# Patient Record
Sex: Male | Born: 1960 | Race: White | Hispanic: No | Marital: Married | Smoking: Current every day smoker
Health system: Southern US, Community
[De-identification: ages and names within clinical notes are randomized; demographics above are authoritative.]

## PROBLEM LIST (undated history)

## (undated) DIAGNOSIS — I6523 Occlusion and stenosis of bilateral carotid arteries: Secondary | ICD-10-CM

## (undated) DIAGNOSIS — R5382 Chronic fatigue, unspecified: Secondary | ICD-10-CM

## (undated) DIAGNOSIS — M179 Osteoarthritis of knee, unspecified: Secondary | ICD-10-CM

## (undated) DIAGNOSIS — E079 Disorder of thyroid, unspecified: Secondary | ICD-10-CM

## (undated) DIAGNOSIS — E785 Hyperlipidemia, unspecified: Secondary | ICD-10-CM

## (undated) DIAGNOSIS — J329 Chronic sinusitis, unspecified: Secondary | ICD-10-CM

## (undated) DIAGNOSIS — M1990 Unspecified osteoarthritis, unspecified site: Secondary | ICD-10-CM

## (undated) HISTORY — DX: Disorder of thyroid, unspecified: E07.9

## (undated) HISTORY — DX: Occlusion and stenosis of bilateral carotid arteries: I65.23

## (undated) HISTORY — DX: Chronic fatigue, unspecified: R53.82

## (undated) HISTORY — DX: Osteoarthritis of knee, unspecified: M17.9

## (undated) HISTORY — DX: Chronic sinusitis, unspecified: J32.9

## (undated) HISTORY — PX: KNEE CARTILAGE SURGERY: SHX688

## (undated) HISTORY — DX: Hyperlipidemia, unspecified: E78.5

## (undated) HISTORY — PX: SKIN GRAFT: SHX250

---

## 2004-05-10 ENCOUNTER — Other Ambulatory Visit (HOSPITAL_COMMUNITY): Admission: RE | Admit: 2004-05-10 | Discharge: 2004-06-04 | Payer: Self-pay | Admitting: Psychiatry

## 2004-05-10 ENCOUNTER — Ambulatory Visit: Payer: Self-pay | Admitting: Psychiatry

## 2005-06-15 ENCOUNTER — Emergency Department (HOSPITAL_COMMUNITY): Admission: EM | Admit: 2005-06-15 | Discharge: 2005-06-15 | Payer: Self-pay | Admitting: Emergency Medicine

## 2009-06-04 ENCOUNTER — Encounter: Admission: RE | Admit: 2009-06-04 | Discharge: 2009-06-04 | Payer: Self-pay | Admitting: Specialist

## 2012-04-25 ENCOUNTER — Emergency Department (HOSPITAL_COMMUNITY)
Admission: EM | Admit: 2012-04-25 | Discharge: 2012-04-25 | Disposition: A | Discharge status: Home / Self Care | Payer: Managed Care, Other (non HMO) | Attending: Emergency Medicine | Admitting: Emergency Medicine

## 2012-04-25 ENCOUNTER — Encounter (HOSPITAL_COMMUNITY): Payer: Self-pay

## 2012-04-25 DIAGNOSIS — F17200 Nicotine dependence, unspecified, uncomplicated: Secondary | ICD-10-CM | POA: Insufficient documentation

## 2012-04-25 DIAGNOSIS — Y9389 Activity, other specified: Secondary | ICD-10-CM | POA: Insufficient documentation

## 2012-04-25 DIAGNOSIS — M129 Arthropathy, unspecified: Secondary | ICD-10-CM | POA: Insufficient documentation

## 2012-04-25 DIAGNOSIS — T1501XA Foreign body in cornea, right eye, initial encounter: Secondary | ICD-10-CM

## 2012-04-25 DIAGNOSIS — T1500XA Foreign body in cornea, unspecified eye, initial encounter: Secondary | ICD-10-CM | POA: Insufficient documentation

## 2012-04-25 DIAGNOSIS — IMO0002 Reserved for concepts with insufficient information to code with codable children: Secondary | ICD-10-CM | POA: Insufficient documentation

## 2012-04-25 DIAGNOSIS — Z79899 Other long term (current) drug therapy: Secondary | ICD-10-CM | POA: Insufficient documentation

## 2012-04-25 DIAGNOSIS — Y9289 Other specified places as the place of occurrence of the external cause: Secondary | ICD-10-CM | POA: Insufficient documentation

## 2012-04-25 HISTORY — DX: Unspecified osteoarthritis, unspecified site: M19.90

## 2012-04-25 MED ORDER — TETRACAINE HCL 0.5 % OP SOLN
2.0000 [drp] | Freq: Once | OPHTHALMIC | Status: AC
  Administered 2012-04-25: 2 [drp] via OPHTHALMIC
  Filled 2012-04-25: qty 2

## 2012-04-25 MED ORDER — HYDROCODONE-ACETAMINOPHEN 5-325 MG PO TABS
1.0000 | ORAL_TABLET | Freq: Once | ORAL | Status: AC
  Administered 2012-04-25: 1 via ORAL
  Filled 2012-04-25: qty 1

## 2012-04-25 MED ORDER — SULFACETAMIDE SODIUM 10 % OP SOLN
2.0000 [drp] | OPHTHALMIC | Status: DC
  Filled 2012-04-25: qty 15

## 2012-04-25 MED ORDER — FLUORESCEIN SODIUM 1 MG OP STRP
1.0000 | ORAL_STRIP | Freq: Once | OPHTHALMIC | Status: AC
  Administered 2012-04-25: 1 via OPHTHALMIC
  Filled 2012-04-25: qty 1

## 2012-04-25 NOTE — ED Provider Notes (Signed)
History     CSN: [id number]  Arrival date & time 04/25/12  0746   First MD Initiated Contact with Patient 04/25/12 0801      Chief Complaint  Patient presents with  . Eye Pain    (Consider location/radiation/quality/duration/timing/severity/associated sxs/prior treatment) HPI Vincent Gray is a 52 y/o male with new onset right eye pain since yesterday morning. He reports feeling a foreign body in his eye that shifts from the upper lid, lateral cornea, and lower lid. The pain got progressively worse throughout the day yesterday. He was unable to sleep much last night. He states bright lights and the sun agitate the pain. He denies any purulent drainage, fever, cough, congestion, and runny nose.  Patient was working with some people cutting trees.  The patient states he was not actually using the chainsaw just around people using them.  Past Medical History  Diagnosis Date  . Arthritis     Past Surgical History  Procedure Laterality Date  . Skin graft    . Knee cartilage surgery      History reviewed. No pertinent family history.  History  Substance Use Topics  . Smoking status: Current Every Day Smoker -- 1.00 packs/day    Types: Cigarettes  . Smokeless tobacco: Not on file  . Alcohol Use: No      Review of Systems All other systems negative except as documented in the HPI. All pertinent positives and negatives as reviewed in the HPI. Allergies  Review of patient's allergies indicates no known allergies.  Home Medications   Current Outpatient Rx  Name  Route  Sig  Dispense  Refill  . gabapentin (NEURONTIN) 300 MG capsule   Oral   Take 600 mg by mouth 2 (two) times daily.         Marland Kitchen HYDROcodone-acetaminophen (LORCET) 10-650 MG per tablet   Oral   Take 1 tablet by mouth every 6 (six) hours as needed for pain.         Marland Kitchen levothyroxine (SYNTHROID, LEVOTHROID) 125 MCG tablet   Oral   Take 250 mcg by mouth daily.         Marland Kitchen loratadine (CLARITIN) 10 MG tablet  Oral   Take 10 mg by mouth daily.           BP 121/83  Pulse 101  Temp(Src) 98.3 F (36.8 C) (Oral)  Resp 20  SpO2 100%  Physical Exam  Constitutional: He appears well-developed and well-nourished.  HENT:  Head: Normocephalic and atraumatic.  Eyes: Pupils are equal, round, and reactive to light. No foreign bodies found. Right conjunctiva is injected.  Slit lamp exam:      The right eye shows foreign body.      ED Course  Procedures (including critical care time)  I was numbed with tetracaine and attempted to remove foreign body, but did not get aggressive with this.  I spoke with Dr. Randon Goldsmith of ophthalmology, who agrees to see the patient as soon as he leaves the ER in his office.  Patient agrees to plan.  Told to return here as needed  MDM          Carlyle Dolly, PA-C 04/25/12 [phone]

## 2012-04-25 NOTE — ED Notes (Signed)
Pt escorted to discharge window. Verbalized understanding discharge instructions. In no acute distress. Pt given directions to Opthalmologist office.

## 2012-04-25 NOTE — ED Notes (Addendum)
Pt c/o pain and "something in" R eye x 3 days.  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  Sts he has tried to clean it out at an eye wash station and tried to flush it with eye drops, but no relief.  Pain score 8/10.  Sts blurred vision.  Redness and drainage noted.

## 2012-04-25 NOTE — ED Notes (Signed)
PA at bedside.

## 2012-04-25 NOTE — ED Provider Notes (Signed)
Medical screening examination/treatment/procedure(s) were performed by non-physician practitioner and as supervising physician I was immediately available for consultation/collaboration.   Dione Booze, MD 04/25/12 [phone]

## 2012-04-29 ENCOUNTER — Emergency Department (HOSPITAL_COMMUNITY)
Admission: EM | Admit: 2012-04-29 | Discharge: 2012-04-29 | Disposition: A | Discharge status: Home / Self Care | Payer: Managed Care, Other (non HMO) | Attending: Emergency Medicine | Admitting: Emergency Medicine

## 2012-04-29 ENCOUNTER — Encounter (HOSPITAL_COMMUNITY): Payer: Self-pay | Admitting: Emergency Medicine

## 2012-04-29 DIAGNOSIS — T495X5A Adverse effect of ophthalmological drugs and preparations, initial encounter: Secondary | ICD-10-CM | POA: Insufficient documentation

## 2012-04-29 DIAGNOSIS — Z888 Allergy status to other drugs, medicaments and biological substances status: Secondary | ICD-10-CM | POA: Insufficient documentation

## 2012-04-29 DIAGNOSIS — T7840XA Allergy, unspecified, initial encounter: Secondary | ICD-10-CM

## 2012-04-29 DIAGNOSIS — F17200 Nicotine dependence, unspecified, uncomplicated: Secondary | ICD-10-CM | POA: Insufficient documentation

## 2012-04-29 DIAGNOSIS — Y929 Unspecified place or not applicable: Secondary | ICD-10-CM | POA: Insufficient documentation

## 2012-04-29 DIAGNOSIS — H538 Other visual disturbances: Secondary | ICD-10-CM | POA: Insufficient documentation

## 2012-04-29 DIAGNOSIS — J3489 Other specified disorders of nose and nasal sinuses: Secondary | ICD-10-CM | POA: Insufficient documentation

## 2012-04-29 DIAGNOSIS — Z8739 Personal history of other diseases of the musculoskeletal system and connective tissue: Secondary | ICD-10-CM | POA: Insufficient documentation

## 2012-04-29 DIAGNOSIS — Y9389 Activity, other specified: Secondary | ICD-10-CM | POA: Insufficient documentation

## 2012-04-29 DIAGNOSIS — T1590XA Foreign body on external eye, part unspecified, unspecified eye, initial encounter: Secondary | ICD-10-CM | POA: Insufficient documentation

## 2012-04-29 DIAGNOSIS — Z79899 Other long term (current) drug therapy: Secondary | ICD-10-CM | POA: Insufficient documentation

## 2012-04-29 MED ORDER — DIPHENHYDRAMINE HCL 25 MG PO TABS: 25.0000 mg | ORAL_TABLET | Freq: Four times a day (QID) | ORAL | Status: DC

## 2012-04-29 MED ORDER — OXYCODONE-ACETAMINOPHEN 5-325 MG PO TABS: 1.0000 | ORAL_TABLET | ORAL | Status: DC | PRN

## 2012-04-29 MED ORDER — OXYCODONE-ACETAMINOPHEN 5-325 MG PO TABS
1.0000 | ORAL_TABLET | Freq: Once | ORAL | Status: AC
  Administered 2012-04-29: 1 via ORAL
  Filled 2012-04-29: qty 1

## 2012-04-29 NOTE — ED Provider Notes (Signed)
History     CSN: [id number]  Arrival date & time 04/29/12  9147   First MD Initiated Contact with Patient 04/29/12 0945      Chief Complaint  Patient presents with  . Eye Injury    (Consider location/radiation/quality/duration/timing/severity/associated sxs/prior treatment) HPI Comments: Patient was seen in ED 4 days ago for corneal FB, was sent directly to Dr Randon Goldsmith, opthalmology, who removed all of FB from left eye and 85% from right eye, placed pt on bacitracin opth. Ointment.  Pt noted that when he started using the opth ointment his eyes became puffy and itchy and red, in addition to the initial discomfort of the foreign body.  He called Dr Randon Goldsmith' office and was told to add artificial tears, which he did.  States these seem to burn and sting when he uses them.  He is now out of the bacitracin ointment.  Continues to have blurry vision, states discomfort, puffiness, and redness are getting worse.  He has an appointment to see Dr Randon Goldsmith tomorrow.  Denies fevers, upper respiratory symptoms.    Patient is a 52 y.o. male presenting with eye injury. The history is provided by the patient, medical records and a significant other.  Eye Injury Pertinent negatives include no coughing, fever or sore throat.    Past Medical History  Diagnosis Date  . Arthritis     Past Surgical History  Procedure Laterality Date  . Skin graft    . Knee cartilage surgery      No family history on file.  History  Substance Use Topics  . Smoking status: Current Every Day Smoker -- 1.00 packs/day    Types: Cigarettes  . Smokeless tobacco: Not on file  . Alcohol Use: No      Review of Systems  Constitutional: Negative for fever.  HENT: Positive for rhinorrhea. Negative for sore throat and sinus pressure.   Eyes: Positive for pain, discharge, redness, itching and visual disturbance.  Respiratory: Negative for cough and shortness of breath.     Allergies  Review of patient's allergies indicates  no known allergies.  Home Medications   Current Outpatient Rx  Name  Route  Sig  Dispense  Refill  . gabapentin (NEURONTIN) 300 MG capsule   Oral   Take 600 mg by mouth 2 (two) times daily.         Marland Kitchen HYDROcodone-acetaminophen (LORCET) 10-650 MG per tablet   Oral   Take 1 tablet by mouth every 6 (six) hours as needed for pain.         Marland Kitchen levothyroxine (SYNTHROID, LEVOTHROID) 125 MCG tablet   Oral   Take 250 mcg by mouth daily.         Marland Kitchen loratadine (CLARITIN) 10 MG tablet   Oral   Take 10 mg by mouth daily.           BP 93/74  Pulse 84  Temp(Src) 98 F (36.7 C) (Oral)  Resp 18  SpO2 100%  Physical Exam  Nursing note and vitals reviewed. Constitutional: He appears well-developed and well-nourished. No distress.  HENT:  Head: Normocephalic and atraumatic.  Eyes: EOM are normal. Pupils are equal, round, and reactive to light. Foreign body present in the right eye. Right conjunctiva is injected. Left conjunctiva is injected. Right eye exhibits normal extraocular motion. Left eye exhibits normal extraocular motion.    Soft tissue surrounding bilateral eyes is erythematous and edematous.  Nontender.  Appearance consistent with allergic reaction.    Neck: Neck supple.  Pulmonary/Chest: Effort normal.  Neurological: He is alert.  Skin: He is not diaphoretic.    ED Course  Procedures (including critical care time)  Labs Reviewed - No data to display No results found.  Discussed treatment options with Dr Lynelle Doctor.  Plan for d/c home with benadryl for localized allergic reaction, will not add any more eye drops as patient's eyes are already very irritated.  Pt to see opth. Tomorrow for follow up.    1. Allergic reaction caused by a drug       MDM  Pt with known FB in right eye, followed by opth. With apparent allergic reaction to antibiotic eye ointment started 4 days ago.  Pt is now out of ointment, will see opth. Tomorrow.  Exam consistent with allergy.  Doubt  cellulitis - nontender, no pain.  EOMs intact without pain.  No fever.  See above for plan.  Discussed all results with patient.  Pt given return precautions.  Pt verbalizes understanding and agrees with plan.           Trixie Dredge, PA-C 04/29/12 1125

## 2012-04-29 NOTE — ED Notes (Signed)
Pt presenting to ed with c/o swelling and redness to bilateral eyes. Pt states he got metal in his eyes on Wednesday and was seen here and sent to eye doctor but he feels worse.

## 2012-04-29 NOTE — ED Notes (Signed)
Opthalmologic took 85% of the metal out of his left eye and there was still some metal left in his right eye. Pt was advised to put bacitracin ointment in eyes them come back Monday (tomorrow) then get the rest of metal out of right eye. Pt called on Friday because the pt eyes were so dry pt was advised to use artificial tears. Pt started to have swelling to eye lids and redness to eyes on Thursday. Now on Sunday pt eyes lids are edematous and sclera is red and dry. Pt vision is blurry.

## 2012-04-29 NOTE — ED Notes (Signed)
Eye box in room 

## 2012-05-01 NOTE — ED Provider Notes (Signed)
Medical screening examination/treatment/procedure(s) were performed by non-physician practitioner and as supervising physician I was immediately available for consultation/collaboration.    Darelle Kings R Niklaus Mamaril, MD 05/01/12 2249 

## 2012-12-04 ENCOUNTER — Encounter: Payer: Self-pay | Admitting: Specialist

## 2013-02-05 ENCOUNTER — Emergency Department (HOSPITAL_COMMUNITY)
Admission: EM | Admit: 2013-02-05 | Discharge: 2013-02-05 | Disposition: A | Discharge status: Home / Self Care | Payer: Managed Care, Other (non HMO) | Attending: Emergency Medicine | Admitting: Emergency Medicine

## 2013-02-05 ENCOUNTER — Encounter (HOSPITAL_COMMUNITY): Payer: Self-pay | Admitting: Emergency Medicine

## 2013-02-05 DIAGNOSIS — B0089 Other herpesviral infection: Secondary | ICD-10-CM

## 2013-02-05 DIAGNOSIS — F17200 Nicotine dependence, unspecified, uncomplicated: Secondary | ICD-10-CM | POA: Insufficient documentation

## 2013-02-05 DIAGNOSIS — T375X5A Adverse effect of antiviral drugs, initial encounter: Secondary | ICD-10-CM | POA: Insufficient documentation

## 2013-02-05 DIAGNOSIS — M129 Arthropathy, unspecified: Secondary | ICD-10-CM | POA: Insufficient documentation

## 2013-02-05 DIAGNOSIS — Z79899 Other long term (current) drug therapy: Secondary | ICD-10-CM | POA: Insufficient documentation

## 2013-02-05 DIAGNOSIS — T7840XA Allergy, unspecified, initial encounter: Secondary | ICD-10-CM

## 2013-02-05 DIAGNOSIS — L130 Dermatitis herpetiformis: Secondary | ICD-10-CM | POA: Insufficient documentation

## 2013-02-05 MED ORDER — VALACYCLOVIR HCL 1 G PO TABS: 1000.0000 mg | ORAL_TABLET | Freq: Three times a day (TID) | ORAL | Status: DC

## 2013-02-05 MED ORDER — FAMOTIDINE 20 MG PO TABS: 20.0000 mg | ORAL_TABLET | Freq: Two times a day (BID) | ORAL | Status: DC

## 2013-02-05 MED ORDER — CEPHALEXIN 500 MG PO CAPS: 500.0000 mg | ORAL_CAPSULE | Freq: Three times a day (TID) | ORAL | Status: DC

## 2013-02-05 MED ORDER — PREDNISONE 20 MG PO TABS: ORAL_TABLET | ORAL | Status: DC

## 2013-02-05 MED ORDER — HYDROXYZINE HCL 25 MG PO TABS: ORAL_TABLET | ORAL | Status: DC

## 2013-02-05 NOTE — ED Notes (Signed)
Pt put on antibiotics for rash on face on Wednesday; started breaking out in rash all over yesterday morning; itching

## 2013-02-05 NOTE — ED Provider Notes (Signed)
CSN: [id number]     Arrival date & time 02/05/13  7829 History   First MD Initiated Contact with Patient 02/05/13 [phone]     Chief Complaint  Patient presents with  . Rash   (Consider location/radiation/quality/duration/timing/severity/associated sxs/prior Treatment) HPI Patient reports that his wife has been diagnosed with herpes and he states he has also been diagnosed with herpes although he's never had a major outbreak. He states he normally has a small lesion on his face like a cold sore and it goes away very easily. He reports one week ago he started getting blistering on the cheeks of his face and around his mouth. He states he feels like he may have spread the rash by shaving with an electric shaver. He was seen by his PCP 2 days later and was started on acyclovir which he was taking 4 times a day on December 18. He reports he only took it a couple days and then he started having spreading of a pruritic rash on his arms and trunk 2 nights ago. He states the rash on his face was initially itching and now is a burning discomfort. He states it was draining. He denies having any lesions in his mouth. He denies having any eye pain. He states he has been taking Benadryl without relief of the itching. He denies any fever.  PCP Dr Roseanne Reno  Past Medical History  Diagnosis Date  . Arthritis    Past Surgical History  Procedure Laterality Date  . Skin graft    . Knee cartilage surgery     No family history on file. History  Substance Use Topics  . Smoking status: Current Every Day Smoker -- 1.00 packs/day    Types: Cigarettes  . Smokeless tobacco: Not on file  . Alcohol Use: No  employed Lives with spouse  Review of Systems  Allergies  Review of patient's allergies indicates no known allergies.  Home Medications   Current Outpatient Rx  Name  Route  Sig  Dispense  Refill  . Artificial Tears 0.1-0.3 % SOLN   Ophthalmic   Apply to eye.         . bacitracin ophthalmic  ointment   Both Eyes   Place 1 application into both eyes 4 (four) times daily. apply to eye         . diphenhydrAMINE (BENADRYL) 25 MG tablet   Oral   Take 1 tablet (25 mg total) by mouth every 6 (six) hours. X 3 days, then as needed   20 tablet   0   . gabapentin (NEURONTIN) 300 MG capsule   Oral   Take 600 mg by mouth 2 (two) times daily.         Marland Kitchen HYDROcodone-acetaminophen (LORCET) 10-650 MG per tablet   Oral   Take 1 tablet by mouth every 6 (six) hours as needed for pain.         Marland Kitchen levothyroxine (SYNTHROID, LEVOTHROID) 125 MCG tablet   Oral   Take 250 mcg by mouth daily.         Marland Kitchen loratadine (CLARITIN) 10 MG tablet   Oral   Take 10 mg by mouth daily.         Marland Kitchen oxyCODONE-acetaminophen (PERCOCET/ROXICET) 5-325 MG per tablet   Oral   Take 1 tablet by mouth every 4 (four) hours as needed for pain.   6 tablet   0    BP 110/72  Pulse 87  Temp(Src) 98.7 F (37.1 C) (Oral)  Resp 18  SpO2 99%  Vital signs normal   Physical Exam  Nursing note and vitals reviewed. Constitutional: He is oriented to person, place, and time. He appears well-developed and well-nourished.  Non-toxic appearance. He does not appear ill. No distress.  HENT:  Head: Normocephalic and atraumatic.  Right Ear: External ear normal.  Left Ear: External ear normal.  Nose: Nose normal. No mucosal edema or rhinorrhea.  Mouth/Throat: Oropharynx is clear and moist and mucous membranes are normal. No dental abscesses or uvula swelling.  No lesions in his mouth.  Eyes: Conjunctivae and EOM are normal. Pupils are equal, round, and reactive to light.  Neck: Normal range of motion and full passive range of motion without pain. Neck supple.  Cardiovascular: Normal rate, regular rhythm and normal heart sounds.  Exam reveals no gallop and no friction rub.   No murmur heard. Pulmonary/Chest: Effort normal and breath sounds normal. No respiratory distress. He has no wheezes. He has no rhonchi. He has no  rales. He exhibits no tenderness and no crepitus.  Abdominal: Soft. Normal appearance and bowel sounds are normal. He exhibits no distension. There is no tenderness. There is no rebound and no guarding.  Musculoskeletal: Normal range of motion. He exhibits no edema and no tenderness.  Moves all extremities well.   Neurological: He is alert and oriented to person, place, and time. He has normal strength. No cranial nerve deficit.  Skin: Skin is warm, dry and intact. Rash noted. No erythema. No pallor.  Patient has diffuse redness of his face especially around his mouth with some clear drainage and crustiness. Please see photo. He also has some small round lesions scattered on his back, chest, and upper arms, there are no partial seen there is no drainage seen from these lesions. They are not coalescing.  Psychiatric: He has a normal mood and affect. His speech is normal and behavior is normal. His mood appears not anxious.          ED Course  Procedures (including critical care time)  PT drove himself to the ED and has no one to pick him up. He will be given prescriptions.    Pt does not have an ophthalmologist.    Labs Review Labs Reviewed - No data to display Imaging Review No results found.  EKG Interpretation   None       MDM   1. Herpes dermatitis   2. Allergic reaction, initial encounter      New Prescriptions   CEPHALEXIN (KEFLEX) 500 MG CAPSULE    Take 1 capsule (500 mg total) by mouth 3 (three) times daily.   FAMOTIDINE (PEPCID) 20 MG TABLET    Take 1 tablet (20 mg total) by mouth 2 (two) times daily.   HYDROXYZINE (ATARAX/VISTARIL) 25 MG TABLET    Take 1 or 2 po Q 6hrs for itchng   PREDNISONE (DELTASONE) 20 MG TABLET    Take 3 po QD x 3d , then 2 po QD x 3d then 1 po QD x 3d   VALACYCLOVIR (VALTREX) 1000 MG TABLET    Take 1 tablet (1,000 mg total) by mouth 3 (three) times daily.    Plan discharge   Devoria Albe, MD, Franz Dell, MD 02/05/13  [phone]

## 2013-02-17 ENCOUNTER — Emergency Department (HOSPITAL_COMMUNITY)
Admission: EM | Admit: 2013-02-17 | Discharge: 2013-02-17 | Disposition: A | Discharge status: Home / Self Care | Payer: Managed Care, Other (non HMO) | Attending: Emergency Medicine | Admitting: Emergency Medicine

## 2013-02-17 ENCOUNTER — Encounter (HOSPITAL_COMMUNITY): Payer: Self-pay | Admitting: Emergency Medicine

## 2013-02-17 DIAGNOSIS — M129 Arthropathy, unspecified: Secondary | ICD-10-CM | POA: Insufficient documentation

## 2013-02-17 DIAGNOSIS — F17200 Nicotine dependence, unspecified, uncomplicated: Secondary | ICD-10-CM | POA: Insufficient documentation

## 2013-02-17 DIAGNOSIS — R21 Rash and other nonspecific skin eruption: Secondary | ICD-10-CM | POA: Insufficient documentation

## 2013-02-17 DIAGNOSIS — Z79899 Other long term (current) drug therapy: Secondary | ICD-10-CM | POA: Insufficient documentation

## 2013-02-17 DIAGNOSIS — Z792 Long term (current) use of antibiotics: Secondary | ICD-10-CM | POA: Insufficient documentation

## 2013-02-17 MED ORDER — HYDROCODONE-ACETAMINOPHEN 10-325 MG PO TABS: 1.0000 | ORAL_TABLET | Freq: Four times a day (QID) | ORAL | Status: AC | PRN

## 2013-02-17 NOTE — ED Provider Notes (Signed)
CSN: [id number]     Arrival date & time 02/17/13  1203 History  This chart was scribed for non-physician practitioner, Elpidio AnisShari Jerik Falletta, PA-C working with Raeford RazorStephen Kohut, MD by Greggory StallionKayla Andersen, ED scribe. This patient was seen in room WTR7/WTR7 and the patient's care was started at 1:38 PM.   Chief Complaint  Patient presents with  . Rash   The history is provided by the patient. No language interpreter was used.   HPI Comments: Noralee StainKenneth D Rineer is a 53 y.o. male who presents to the Emergency Department complaining of a burning rash around his mouth that goes up to his right eye that started 3 weeks ago. Pt states it has reddened and gotten swollen. States there is some discharge at times. He was evaluated by his PCP and given a medication but had an allergic reaction to it. Pt was evaluated here for the allergic reaction and given a prescription cream and antibiotic with temporary relief of the rash.   Past Medical History  Diagnosis Date  . Arthritis    Past Surgical History  Procedure Laterality Date  . Skin graft    . Knee cartilage surgery     History reviewed. No pertinent family history. History  Substance Use Topics  . Smoking status: Current Every Day Smoker -- 1.00 packs/day    Types: Cigarettes  . Smokeless tobacco: Not on file  . Alcohol Use: No    Review of Systems  Skin: Positive for rash.  All other systems reviewed and are negative.   Allergies  Acyclovir and related  Home Medications   Current Outpatient Rx  Name  Route  Sig  Dispense  Refill  . Artificial Tears 0.1-0.3 % SOLN   Ophthalmic   Apply to eye.         . cephALEXin (KEFLEX) 500 MG capsule   Oral   Take 1 capsule (500 mg total) by mouth 3 (three) times daily.   30 capsule   0   . diphenhydrAMINE (BENADRYL) 25 MG tablet   Oral   Take 1 tablet (25 mg total) by mouth every 6 (six) hours. X 3 days, then as needed   20 tablet   0   . gabapentin (NEURONTIN) 300 MG capsule   Oral   Take 600  mg by mouth 2 (two) times daily.         Marland Kitchen. HYDROcodone-acetaminophen (LORCET) 10-650 MG per tablet   Oral   Take 1 tablet by mouth every 6 (six) hours as needed for pain.         Marland Kitchen. ibuprofen (ADVIL,MOTRIN) 200 MG tablet   Oral   Take 600 mg by mouth every 6 (six) hours as needed.         Marland Kitchen. levothyroxine (SYNTHROID, LEVOTHROID) 125 MCG tablet   Oral   Take 125 mcg by mouth 2 (two) times daily.          . valACYclovir (VALTREX) 1000 MG tablet   Oral   Take 1 tablet (1,000 mg total) by mouth 3 (three) times daily.   21 tablet   0    BP 131/83  Pulse 89  Temp(Src) 98.2 F (36.8 C) (Oral)  Resp 20  SpO2 100%  Physical Exam  Nursing note and vitals reviewed. Constitutional: He is oriented to person, place, and time. He appears well-developed and well-nourished. No distress.  HENT:  Head: Normocephalic and atraumatic.  Eyes: EOM are normal.  Neck: Neck supple. No tracheal deviation present.  Pulmonary/Chest: Effort  normal. No respiratory distress.  Musculoskeletal: Normal range of motion.  Neurological: He is alert and oriented to person, place, and time.  Skin: Skin is warm and dry.  Red, dry rash on face in perioral are and bilateral medial upper and lower eye lids. No vesicles, blisters, drainage. Rash extends to anterior neck.   Psychiatric: He has a normal mood and affect. His behavior is normal.    ED Course  Procedures (including critical care time)  DIAGNOSTIC STUDIES: Oxygen Saturation is 100% on RA, normal by my interpretation.    COORDINATION OF CARE: 1:43 PM-Discussed treatment plan which includes dermatology referral with pt at bedside and pt agreed to plan.   Labs Review Labs Reviewed - No data to display Imaging Review No results found.  EKG Interpretation   None       MDM  No diagnosis found. 1. Rash  Patient reports rash for greater than 4 weeks and has had multiple drug therapies without resolution. Refer to dermatology.  I  personally performed the services described in this documentation, which was scribed in my presence. The recorded information has been reviewed and is accurate.    Arnoldo Hooker, PA-C 02/17/13 1401

## 2013-02-17 NOTE — Discharge Instructions (Signed)
FOLLOW UP WITH DERMATOLOGY FOR FURTHER EVALUATION OF PERSISTENT RASH.

## 2013-02-17 NOTE — ED Notes (Signed)
Pt here for rash on face. Pt face around mouth and up to R eye reddened and swollen. Pt states it oozes at times. Was here several days ago for same, was given prescription cream, ran out 3 days ago and rash flared back up.

## 2013-02-17 NOTE — ED Provider Notes (Signed)
Medical screening examination/treatment/procedure(s) were performed by non-physician practitioner and as supervising physician I was immediately available for consultation/collaboration.  EKG Interpretation   None        Philisha Weinel, MD 02/17/13 1809 

## 2015-02-02 ENCOUNTER — Ambulatory Visit: Payer: Managed Care, Other (non HMO) | Admitting: Endocrinology

## 2015-04-13 ENCOUNTER — Encounter: Payer: Self-pay | Admitting: Endocrinology

## 2015-04-13 ENCOUNTER — Ambulatory Visit (INDEPENDENT_AMBULATORY_CARE_PROVIDER_SITE_OTHER): Payer: Managed Care, Other (non HMO) | Admitting: Endocrinology

## 2015-04-13 VITALS — BP 106/68 | HR 68 | Temp 97.8°F | Ht 70.25 in | Wt 128.1 lb

## 2015-04-13 DIAGNOSIS — J309 Allergic rhinitis, unspecified: Secondary | ICD-10-CM

## 2015-04-13 DIAGNOSIS — E039 Hypothyroidism, unspecified: Secondary | ICD-10-CM

## 2015-04-13 DIAGNOSIS — G629 Polyneuropathy, unspecified: Secondary | ICD-10-CM | POA: Diagnosis not present

## 2015-04-13 LAB — T4, FREE: FREE T4: 0.45 ng/dL — AB (ref 0.60–1.60)

## 2015-04-13 LAB — TSH: TSH: 96 u[IU]/mL — ABNORMAL HIGH (ref 0.35–4.50)

## 2015-04-13 MED ORDER — LEVOTHYROXINE SODIUM 200 MCG PO TABS: 200.0000 ug | ORAL_TABLET | Freq: Every day | ORAL | Status: DC

## 2015-04-13 NOTE — Patient Instructions (Signed)
blood tests are requested for you today.  We'll let you know about the results.  Let's recheck the ultrasound.  you will receive a phone call, about a day and time for an appointment.       Levothyroxine oral capsules What is this medicine? LEVOTHYROXINE (lee voe thye ROX een) is a thyroid hormone. This medicine can improve symptoms of thyroid deficiency such as slow speech, lack of energy, weight gain, hair loss, dry skin, and feeling cold. It also helps to treat goiter (an enlarged thyroid gland). It is also used to treat some kinds of thyroid cancer along with surgery and other medicines. This medicine may be used for other purposes; ask your health care provider or pharmacist if you have questions. What should I tell my health care provider before I take this medicine? They need to know if you have any of these conditions: -angina -blood clotting problems -diabetes -dieting or on a weight loss program -fertility problems -heart disease -high levels of thyroid hormone -pituitary gland problem -previous heart attack -an unusual or allergic reaction to levothyroxine, thyroid hormones, other medicines, foods, dyes, or preservatives -pregnant or trying to get pregnant -breast-feeding How should I use this medicine? Take this medicine by mouth with a glass of water. It is best to take on an empty stomach, at least 30 minutes to one hour before breakfast. Avoid taking antacids containing aluminum or magnesium, simethicone, bile acid sequestrants, calcium carbonate, sodium polystyrene sulfonate, ferrous sulfate, and sucralfate within 4 hours of taking this medicine. Do not cut, crush or chew this medicine. Follow the directions on the prescription label. Take at the same time each day. Do not take your medicine more often than directed. Talk to your pediatrician regarding the use of this medicine in children. While this drug may be prescribed for selected conditions, precautions do apply.  Since the capsules cannot be crushed or placed in water, they may only be given to infants and children who are able to swallow an intact capsule. Overdosage: If you think you have taken too much of this medicine contact a poison control center or emergency room at once. NOTE: This medicine is only for you. Do not share this medicine with others. What if I miss a dose? If you miss a dose, take it as soon as you can. If it is almost time for your next dose, take only that dose. Do not take double or extra doses. What may interact with this medicine? -amiodarone -antacids -anti-thyroid medicines -calcium supplements -carbamazepine -cholestyramine -colestipol -digoxin -male hormones, including contraceptive or birth control pills -iron supplements -ketamine -liquid nutrition products like Ensure -medicines for colds and breathing difficulties -medicines for diabetes -medicines for mental depression -medicines or herbals used to decrease weight or appetite -phenobarbital or other barbiturate medications -phenytoin -prednisone or other corticosteroids -rifabutin -rifampin -simethicone -sodium polystyrene sulfonate -soy isoflavones -sucralfate -theophylline -warfarin This list may not describe all possible interactions. Give your health care provider a list of all the medicines, herbs, non-prescription drugs, or dietary supplements you use. Also tell them if you smoke, drink alcohol, or use illegal drugs. Some items may interact with your medicine. What should I watch for while using this medicine? Do not switch brands of this medicine unless your health care professional agrees with the change. Ask questions if you are uncertain. You will need regular exams and occasional blood tests to check the response to treatment. If you are receiving this medicine for an underactive thyroid, it may be several  weeks before you notice an improvement. Check with your doctor or health care  professional if your symptoms do not improve. It may be necessary for you to take this medicine for the rest of your life. Do not stop using this medicine unless your doctor or health care professional advises you to. This medicine can affect blood sugar levels. If you have diabetes, check your blood sugar as directed. You may lose some of your hair when you first start treatment. With time, this usually corrects itself. If you are going to have surgery, tell your doctor or health care professional that you are taking this medicine. What side effects may I notice from receiving this medicine? Side effects that you should report to your doctor or health care professional as soon as possible: -allergic reactions like skin rash, itching or hives, swelling of the face, lips, or tongue -chest pain -excessive sweating or intolerance to heat -fast or irregular heartbeat -nervousness -swelling of ankles, feet, or legs -tremors Side effects that usually do not require medical attention (Report these to your doctor or health care professional if they continue or are bothersome.): -changes in appetite -changes in menstrual periods -diarrhea -hair loss -headache -trouble sleeping -weight loss This list may not describe all possible side effects. Call your doctor for medical advice about side effects. You may report side effects to FDA at 1-800-FDA-1088. Where should I keep my medicine? Keep out of the reach of children. Store at room temperature between 15 and 30 degrees C (59 and 86 degrees F). Protect from light and moisture. Keep container tightly closed. Throw away any unused medicine after the expiration date. NOTE: This sheet is a summary. It may not cover all possible information. If you have questions about this medicine, talk to your doctor, pharmacist, or health care provider.    2016, Elsevier/Gold Standard. (2008-04-24 15:25:58)

## 2015-04-13 NOTE — Progress Notes (Signed)
Subjective:    Patient ID: Vincent Gray, male    DOB: 03-19-1960, 55 y.o.   MRN: [id number]  HPI Pt reports hypothyroidism was dx'ed in 1989.  He has been on prescribed thyroid hormone therapy since then.  He has never taken kelp or any other type of non-prescribed thyroid product.  He has never had thyroid surgery, or XRT to the neck.  He has never been on amiodarone or lithium.  He has chronic moderate pain at the legs, and assoc "restless legs."  He has required multiple med adjustments, but he has not taken any rx on the past 8 days.  Most recently, he was taking 2x125 mcg/day.   Past Medical History  Diagnosis Date  . Arthritis     Past Surgical History  Procedure Laterality Date  . Skin graft    . Knee cartilage surgery      Social History   Social History  . Marital Status: Single    Spouse Name: N/A  . Number of Children: N/A  . Years of Education: N/A   Occupational History  . Not on file.   Social History Main Topics  . Smoking status: Current Every Day Smoker -- 1.00 packs/day    Types: Cigarettes  . Smokeless tobacco: Never Used  . Alcohol Use: No  . Drug Use: No  . Sexual Activity: Not on file   Other Topics Concern  . Not on file   Social History Narrative    Current Outpatient Prescriptions on File Prior to Visit  Medication Sig Dispense Refill  . Artificial Tears 0.1-0.3 % SOLN Apply to eye daily as needed.     . diphenhydrAMINE (BENADRYL) 25 MG tablet Take 1 tablet (25 mg total) by mouth every 6 (six) hours. X 3 days, then as needed 20 tablet 0  . gabapentin (NEURONTIN) 300 MG capsule Take 600 mg by mouth 2 (two) times daily.    Marland Kitchen HYDROcodone-acetaminophen (NORCO) 10-325 MG per tablet Take 1 tablet by mouth every 6 (six) hours as needed. 15 tablet 0  . ibuprofen (ADVIL,MOTRIN) 200 MG tablet Take 600 mg by mouth every 6 (six) hours as needed.     No current facility-administered medications on file prior to visit.    Allergies  Allergen  Reactions  . Acyclovir And Related Hives and Rash    Family History  Problem Relation Age of Onset  . Thyroid disease Mother   . Thyroid disease Sister     BP 106/68 mmHg  Pulse 68  Temp(Src) 97.8 F (36.6 C) (Oral)  Ht 5' 10.25" (1.784 m)  Wt 128 lb 2 oz (58.117 kg)  BMI 18.26 kg/m2  Review of Systems denies depression, hair loss, muscle cramps, sob, weight gain, constipation, numbness, blurry vision, cold intolerance, rhinorrhea, easy bruising, and syncope.  He has fatigue and dry skin.      Objective:   Physical Exam VS: see vs page GEN: no distress HEAD: head: no deformity eyes: no periorbital swelling, no proptosis external nose and ears are normal mouth: no lesion seen NECK: 2-3 cm left thyroid nodule CHEST WALL: no deformity LUNGS: clear to auscultation BREASTS:  No gynecomastia CV: reg rate and rhythm, no murmur ABD: abdomen is soft, nontender.  no hepatosplenomegaly.  not distended.  no hernia.   MUSCULOSKELETAL: muscle bulk and strength are grossly normal.  no obvious joint swelling.  gait is normal and steady EXTEMITIES: no deformity.  no edema. PULSES: no carotid bruit.  NEURO:  cn 2-12  grossly intact.   readily moves all 4's.  sensation is intact to touch on all 4's, but decreased on the legs.  SKIN:  Normal texture and temperature.  No rash or suspicious lesion is visible.   NODES:  None palpable at the neck. PSYCH: alert, well-oriented.  Does not appear anxious nor depressed.    [place]: Enlarged and inhomogeneous thyroid gland with somewhat increased vascularity. Consider thyroiditis. No solid or cystic nodule.  Lab Results  Component Value Date   TSH 96.00* 04/13/2015   I have reviewed outside records, and summarized: Pt has been seen in ER several times, for hand rash, oral ulcer, and facial rash.     Assessment & Plan:  Hypothyroidism, worse off rx.  i have sent a prescription to your pharmacy, to resume at 200 mcg/d, and recheck labs in 1  month. Thyroid nodule, noted on exam.  Exam seems different from old [place].     Patient is advised the following: Patient Instructions  blood tests are requested for you today.  We'll let you know about the results.  Let's recheck the ultrasound.  you will receive a phone call, about a day and time for an appointment.       Levothyroxine oral capsules What is this medicine? LEVOTHYROXINE (lee voe thye ROX een) is a thyroid hormone. This medicine can improve symptoms of thyroid deficiency such as slow speech, lack of energy, weight gain, hair loss, dry skin, and feeling cold. It also helps to treat goiter (an enlarged thyroid gland). It is also used to treat some kinds of thyroid cancer along with surgery and other medicines. This medicine may be used for other purposes; ask your health care provider or pharmacist if you have questions. What should I tell my health care provider before I take this medicine? They need to know if you have any of these conditions: -angina -blood clotting problems -diabetes -dieting or on a weight loss program -fertility problems -heart disease -high levels of thyroid hormone -pituitary gland problem -previous heart attack -an unusual or allergic reaction to levothyroxine, thyroid hormones, other medicines, foods, dyes, or preservatives -pregnant or trying to get pregnant -breast-feeding How should I use this medicine? Take this medicine by mouth with a glass of water. It is best to take on an empty stomach, at least 30 minutes to one hour before breakfast. Avoid taking antacids containing aluminum or magnesium, simethicone, bile acid sequestrants, calcium carbonate, sodium polystyrene sulfonate, ferrous sulfate, and sucralfate within 4 hours of taking this medicine. Do not cut, crush or chew this medicine. Follow the directions on the prescription label. Take at the same time each day. Do not take your medicine more often than directed. Talk to your  pediatrician regarding the use of this medicine in children. While this drug may be prescribed for selected conditions, precautions do apply. Since the capsules cannot be crushed or placed in water, they may only be given to infants and children who are able to swallow an intact capsule. Overdosage: If you think you have taken too much of this medicine contact a poison control center or emergency room at once. NOTE: This medicine is only for you. Do not share this medicine with others. What if I miss a dose? If you miss a dose, take it as soon as you can. If it is almost time for your next dose, take only that dose. Do not take double or extra doses. What may interact with this medicine? -amiodarone -antacids -anti-thyroid medicines -calcium  supplements -carbamazepine -cholestyramine -colestipol -digoxin -male hormones, including contraceptive or birth control pills -iron supplements -ketamine -liquid nutrition products like Ensure -medicines for colds and breathing difficulties -medicines for diabetes -medicines for mental depression -medicines or herbals used to decrease weight or appetite -phenobarbital or other barbiturate medications -phenytoin -prednisone or other corticosteroids -rifabutin -rifampin -simethicone -sodium polystyrene sulfonate -soy isoflavones -sucralfate -theophylline -warfarin This list may not describe all possible interactions. Give your health care provider a list of all the medicines, herbs, non-prescription drugs, or dietary supplements you use. Also tell them if you smoke, drink alcohol, or use illegal drugs. Some items may interact with your medicine. What should I watch for while using this medicine? Do not switch brands of this medicine unless your health care professional agrees with the change. Ask questions if you are uncertain. You will need regular exams and occasional blood tests to check the response to treatment. If you are receiving this  medicine for an underactive thyroid, it may be several weeks before you notice an improvement. Check with your doctor or health care professional if your symptoms do not improve. It may be necessary for you to take this medicine for the rest of your life. Do not stop using this medicine unless your doctor or health care professional advises you to. This medicine can affect blood sugar levels. If you have diabetes, check your blood sugar as directed. You may lose some of your hair when you first start treatment. With time, this usually corrects itself. If you are going to have surgery, tell your doctor or health care professional that you are taking this medicine. What side effects may I notice from receiving this medicine? Side effects that you should report to your doctor or health care professional as soon as possible: -allergic reactions like skin rash, itching or hives, swelling of the face, lips, or tongue -chest pain -excessive sweating or intolerance to heat -fast or irregular heartbeat -nervousness -swelling of ankles, feet, or legs -tremors Side effects that usually do not require medical attention (Report these to your doctor or health care professional if they continue or are bothersome.): -changes in appetite -changes in menstrual periods -diarrhea -hair loss -headache -trouble sleeping -weight loss This list may not describe all possible side effects. Call your doctor for medical advice about side effects. You may report side effects to FDA at 1-800-FDA-1088. Where should I keep my medicine? Keep out of the reach of children. Store at room temperature between 15 and 30 degrees C (59 and 86 degrees F). Protect from light and moisture. Keep container tightly closed. Throw away any unused medicine after the expiration date. NOTE: This sheet is a summary. It may not cover all possible information. If you have questions about this medicine, talk to your doctor, pharmacist, or health  care provider.    2016, Elsevier/Gold Standard. (2008-04-24 15:25:58)

## 2015-04-14 DIAGNOSIS — G629 Polyneuropathy, unspecified: Secondary | ICD-10-CM | POA: Insufficient documentation

## 2015-04-14 DIAGNOSIS — J309 Allergic rhinitis, unspecified: Secondary | ICD-10-CM | POA: Insufficient documentation

## 2015-04-24 ENCOUNTER — Ambulatory Visit
Admission: RE | Admit: 2015-04-24 | Discharge: 2015-04-24 | Disposition: A | Discharge status: Home / Self Care | Payer: Managed Care, Other (non HMO) | Source: Ambulatory Visit | Attending: Endocrinology | Admitting: Endocrinology

## 2015-04-24 DIAGNOSIS — E039 Hypothyroidism, unspecified: Secondary | ICD-10-CM

## 2015-05-17 NOTE — Progress Notes (Signed)
Subjective:    Patient ID: Vincent Gray, male    DOB: 06/19/1960, 55 y.o.   MRN: [id number]  HPI Pt returns for f/u of chronic primary hyperthyroidism (dx'ed 1989; he has been on prescribed thyroid hormone therapy since then; he has required multiple med adjustments).  He says he has only missed 2 doses of synthroid in the past month, and that was 2 weeks ago.  pt states he feels well in general.  He says he takes synthroid as advised at last ov.   Past Medical History  Diagnosis Date  . Arthritis     Past Surgical History  Procedure Laterality Date  . Skin graft    . Knee cartilage surgery      Social History   Social History  . Marital Status: Single    Spouse Name: N/A  . Number of Children: N/A  . Years of Education: N/A   Occupational History  . Not on file.   Social History Main Topics  . Smoking status: Current Every Day Smoker -- 1.00 packs/day    Types: Cigarettes  . Smokeless tobacco: Never Used  . Alcohol Use: No  . Drug Use: No  . Sexual Activity: Not on file   Other Topics Concern  . Not on file   Social History Narrative    Current Outpatient Prescriptions on File Prior to Visit  Medication Sig Dispense Refill  . Artificial Tears 0.1-0.3 % SOLN Apply to eye daily as needed.     . diphenhydrAMINE (BENADRYL) 25 MG tablet Take 1 tablet (25 mg total) by mouth every 6 (six) hours. X 3 days, then as needed 20 tablet 0  . fluticasone (FLONASE) 50 MCG/ACT nasal spray Place into both nostrils daily as needed for allergies or rhinitis.    Marland Kitchen. gabapentin (NEURONTIN) 300 MG capsule Take 600 mg by mouth 2 (two) times daily.    Marland Kitchen. HYDROcodone-acetaminophen (NORCO) 10-325 MG per tablet Take 1 tablet by mouth every 6 (six) hours as needed. 15 tablet 0  . ibuprofen (ADVIL,MOTRIN) 200 MG tablet Take 600 mg by mouth every 6 (six) hours as needed.    Marland Kitchen. levothyroxine (SYNTHROID, LEVOTHROID) 200 MCG tablet Take 1 tablet (200 mcg total) by mouth daily before breakfast. 30  tablet 2   No current facility-administered medications on file prior to visit.    Allergies  Allergen Reactions  . Acyclovir And Related Hives and Rash    Family History  Problem Relation Age of Onset  . Thyroid disease Mother   . Thyroid disease Sister     BP 112/70 mmHg  Pulse 68  Temp(Src) 98.2 F (36.8 C) (Oral)  Ht 5' 10.5" (1.791 m)  Wt 129 lb (58.514 kg)  BMI 18.24 kg/m2  Review of Systems No weight change    Objective:   Physical Exam VITAL SIGNS:  See vs page GENERAL: no distress NECK: the thyroid is 3-5 times normal size, R>L.     US: Thyroid tissue is essentially stable. The thyroid tissue is prominent for size and heterogeneous. There is a questionable nodule versus pseudo nodule in the medial right thyroid lobe that has minimally changed since 2011.  Lab Results  Component Value Date   TSH 3.60 05/18/2015      Assessment & Plan:  Hypothyroidism: well-replaced.    Patient is advised the following: Patient Instructions  blood tests are requested for you today.  We'll let you know about the results.      addendum: Please continue  the same medication.   Please come back for a follow-up appointment in 6 months.

## 2015-05-18 ENCOUNTER — Ambulatory Visit (INDEPENDENT_AMBULATORY_CARE_PROVIDER_SITE_OTHER): Payer: Managed Care, Other (non HMO) | Admitting: Endocrinology

## 2015-05-18 ENCOUNTER — Encounter: Payer: Self-pay | Admitting: Endocrinology

## 2015-05-18 VITALS — BP 112/70 | HR 68 | Temp 98.2°F | Ht 70.5 in | Wt 129.0 lb

## 2015-05-18 DIAGNOSIS — E039 Hypothyroidism, unspecified: Secondary | ICD-10-CM

## 2015-05-18 LAB — TSH: TSH: 3.6 u[IU]/mL (ref 0.35–4.50)

## 2015-05-18 NOTE — Patient Instructions (Addendum)
blood tests are requested for you today.  We'll let you know about the results.  

## 2015-10-05 ENCOUNTER — Other Ambulatory Visit: Payer: Self-pay | Admitting: Endocrinology

## 2016-07-21 ENCOUNTER — Other Ambulatory Visit: Payer: Self-pay | Admitting: Endocrinology

## 2016-07-21 NOTE — Telephone Encounter (Signed)
Please refill x 3 mos Ov is due 

## 2017-05-08 ENCOUNTER — Encounter: Payer: Self-pay | Admitting: Endocrinology

## 2017-05-08 ENCOUNTER — Ambulatory Visit: Payer: Managed Care, Other (non HMO) | Admitting: Endocrinology

## 2017-05-08 VITALS — BP 92/64 | HR 83 | Wt 148.0 lb

## 2017-05-08 DIAGNOSIS — E039 Hypothyroidism, unspecified: Secondary | ICD-10-CM | POA: Diagnosis not present

## 2017-05-08 LAB — TSH: TSH: 16.95 u[IU]/mL — ABNORMAL HIGH (ref 0.35–4.50)

## 2017-05-08 LAB — T4, FREE: FREE T4: 0.79 ng/dL (ref 0.60–1.60)

## 2017-05-08 MED ORDER — LEVOTHYROXINE SODIUM 200 MCG PO TABS
200.0000 ug | ORAL_TABLET | Freq: Every day | ORAL | 3 refills | Status: DC
Start: 2017-05-08 — End: 2018-06-15

## 2017-05-08 NOTE — Progress Notes (Signed)
Subjective:    Patient ID: Vincent Gray, male    DOB: 08/15/1960, 57 y.o.   MRN: [id number]  HPI Pt returns for f/u of chronic primary hypothyroidism (dx'ed 1989; he has been on prescribed thyroid hormone therapy since then; he has required multiple med adjustments; therapy has been limited by noncompliance).  Pt says he has missed the synthroid approx 7 days in the past month.  pt states he feels well in general.   Past Medical History:  Diagnosis Date  . Arthritis     Past Surgical History:  Procedure Laterality Date  . KNEE CARTILAGE SURGERY    . SKIN GRAFT      Social History   Socioeconomic History  . Marital status: Single    Spouse name: Not on file  . Number of children: Not on file  . Years of education: Not on file  . Highest education level: Not on file  Occupational History  . Not on file  Social Needs  . Financial resource strain: Not on file  . Food insecurity:    Worry: Not on file    Inability: Not on file  . Transportation needs:    Medical: Not on file    Non-medical: Not on file  Tobacco Use  . Smoking status: Current Every Day Smoker    Packs/day: 1.00    Types: Cigarettes  . Smokeless tobacco: Never Used  Substance and Sexual Activity  . Alcohol use: No    Alcohol/week: 0.0 oz  . Drug use: No  . Sexual activity: Not on file  Lifestyle  . Physical activity:    Days per week: Not on file    Minutes per session: Not on file  . Stress: Not on file  Relationships  . Social connections:    Talks on phone: Not on file    Gets together: Not on file    Attends religious service: Not on file    Active member of club or organization: Not on file    Attends meetings of clubs or organizations: Not on file    Relationship status: Not on file  . Intimate partner violence:    Fear of current or ex partner: Not on file    Emotionally abused: Not on file    Physically abused: Not on file    Forced sexual activity: Not on file  Other Topics  Concern  . Not on file  Social History Narrative  . Not on file    Current Outpatient Medications on File Prior to Visit  Medication Sig Dispense Refill  . Artificial Tears 0.1-0.3 % SOLN Apply to eye daily as needed.     . diphenhydrAMINE (BENADRYL) 25 MG tablet Take 1 tablet (25 mg total) by mouth every 6 (six) hours. X 3 days, then as needed 20 tablet 0  . fluticasone (FLONASE) 50 MCG/ACT nasal spray Place into both nostrils daily as needed for allergies or rhinitis.    Marland Kitchen gabapentin (NEURONTIN) 300 MG capsule Take 600 mg by mouth 2 (two) times daily.    Marland Kitchen HYDROcodone-acetaminophen (NORCO) 10-325 MG per tablet Take 1 tablet by mouth every 6 (six) hours as needed. 15 tablet 0  . ibuprofen (ADVIL,MOTRIN) 200 MG tablet Take 600 mg by mouth every 6 (six) hours as needed.     No current facility-administered medications on file prior to visit.     Allergies  Allergen Reactions  . Acyclovir And Related Hives and Rash    Family History  Problem  Relation Age of Onset  . Thyroid disease Mother   . Thyroid disease Sister     BP 92/64 (BP Location: Left Arm, Patient Position: Sitting, Cuff Size: Normal)   Pulse 83   Wt 148 lb (67.1 kg)   SpO2 96%   BMI 20.94 kg/m    Review of Systems He has regained some weight.     Objective:   Physical Exam VITAL SIGNS:  See vs page GENERAL: no distress NECK: the left lobe of the thyroid is slightly enlarged, but no thyroid nodule is palpable.  No palpable lymphadenopathy at the anterior neck.        Assessment & Plan:  Hypothyroidism: due for recheck.   Patient Instructions  blood tests are requested for you today.  We'll let you know about the results.   It is best to never miss the thyroid medication.   However, if you do miss a day, you can take 2 the next day.  Please come back for a follow-up appointment in 6 months.

## 2017-05-08 NOTE — Patient Instructions (Addendum)
blood tests are requested for you today.  We'll let you know about the results.   It is best to never miss the thyroid medication.   However, if you do miss a day, you can take 2 the next day.  Please come back for a follow-up appointment in 6 months.

## 2018-01-26 IMAGING — US US SOFT TISSUE HEAD/NECK
1 series · 14 of 25 positions shown · non-contrast
Comparison: 06/04/2009

CLINICAL DATA: Hypothyroidism.

EXAM:
THYROID ULTRASOUND
TECHNIQUE: Ultrasound examination of the thyroid gland and adjacent soft
tissues was performed.

[Series 1: us soft tissue head/neck · 0.07mm/px · 14 of 47 slices shown]
[im 1/47]
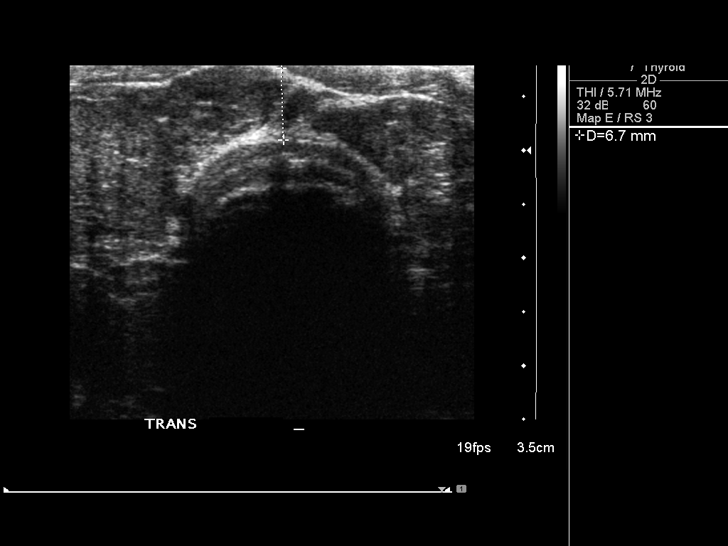
[im 4/47]
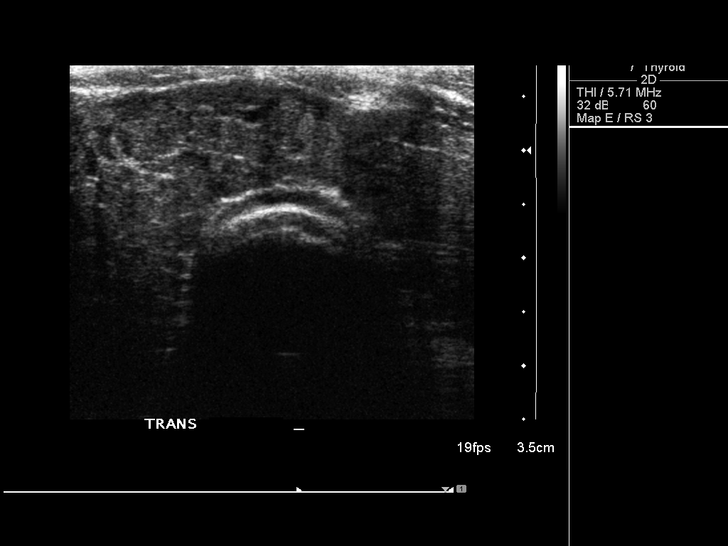
[im 8/47]
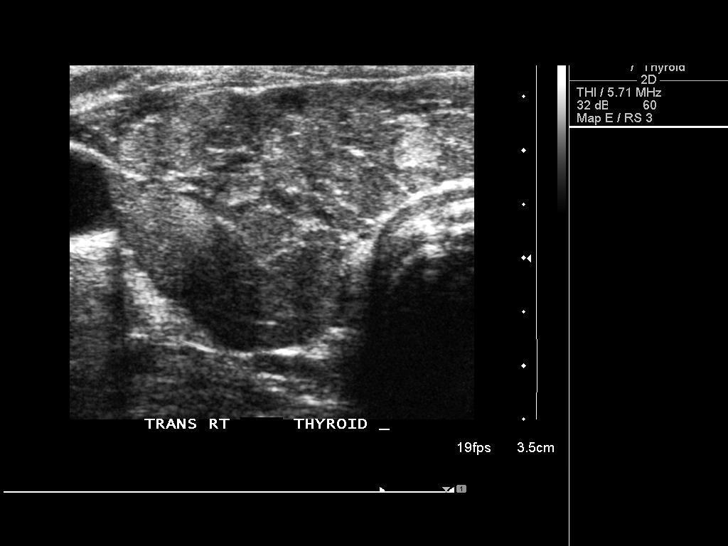
[im 12/47]
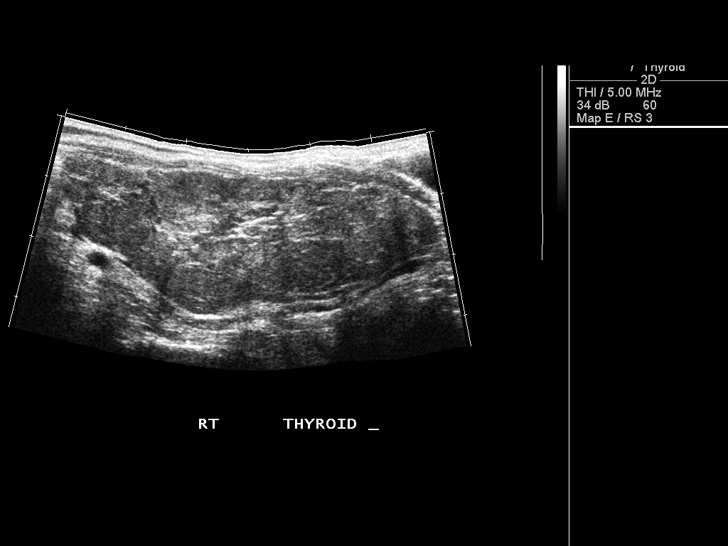
[im 16/47]
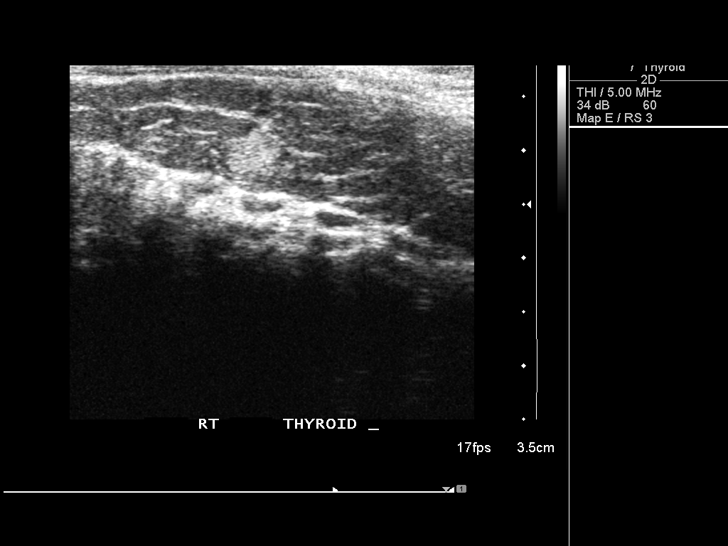
[im 18/47]
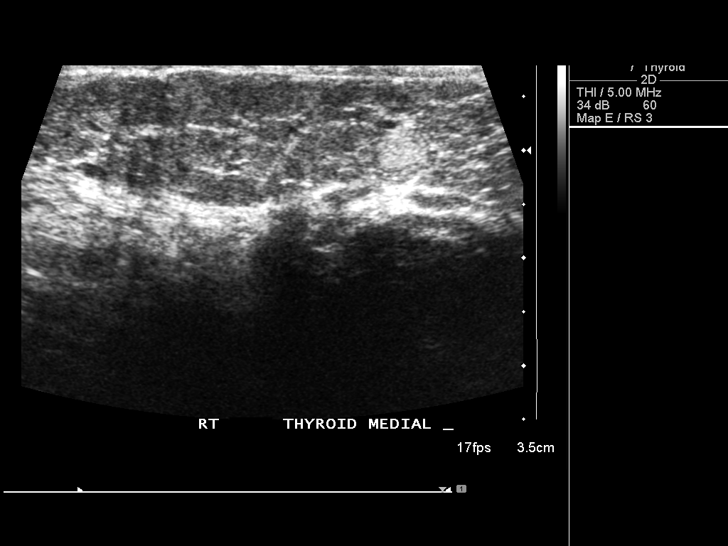
[im 22/47]
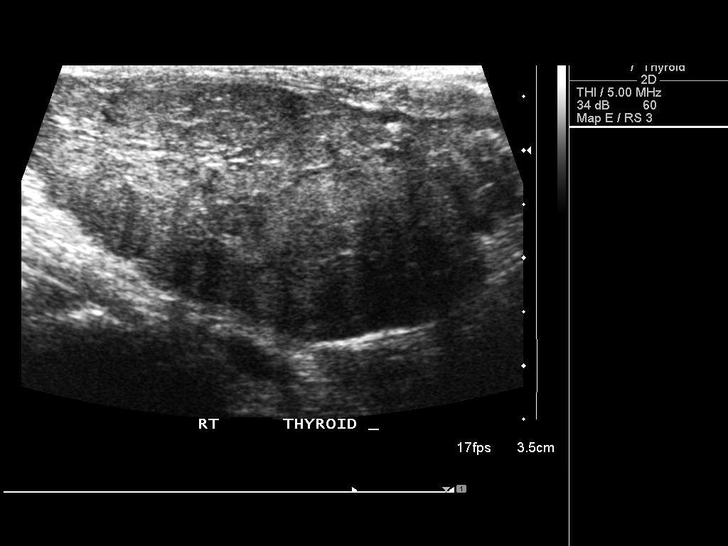
[im 25/47]
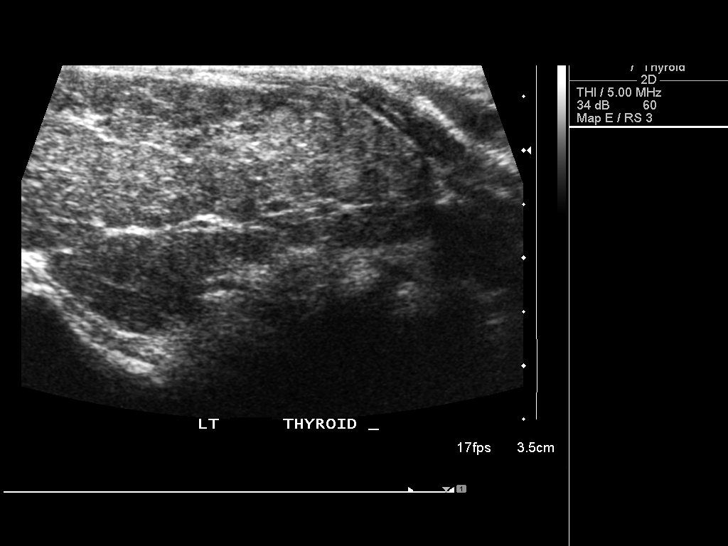
[im 29/47]
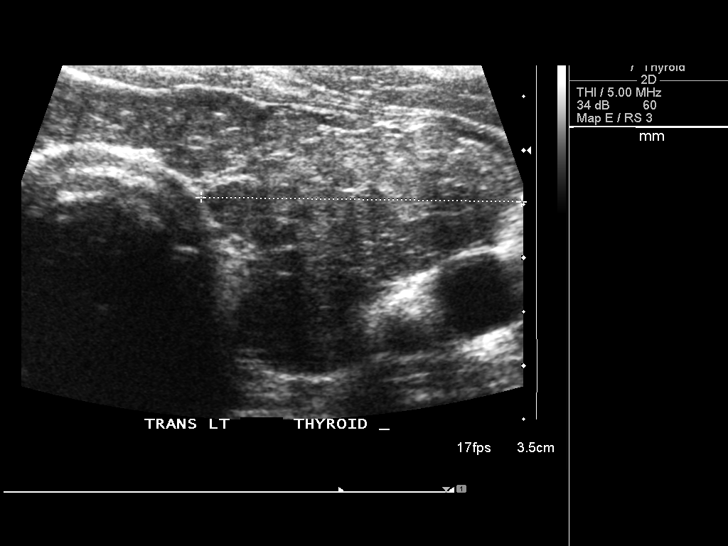
[im 31/47]
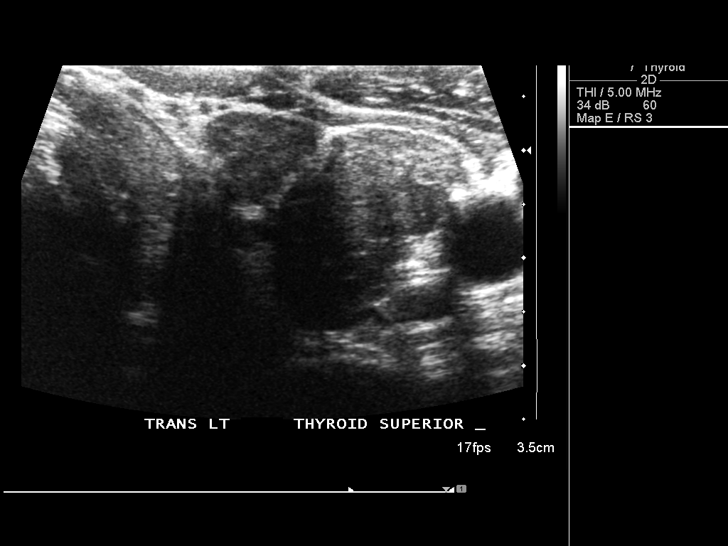
[im 35/47]
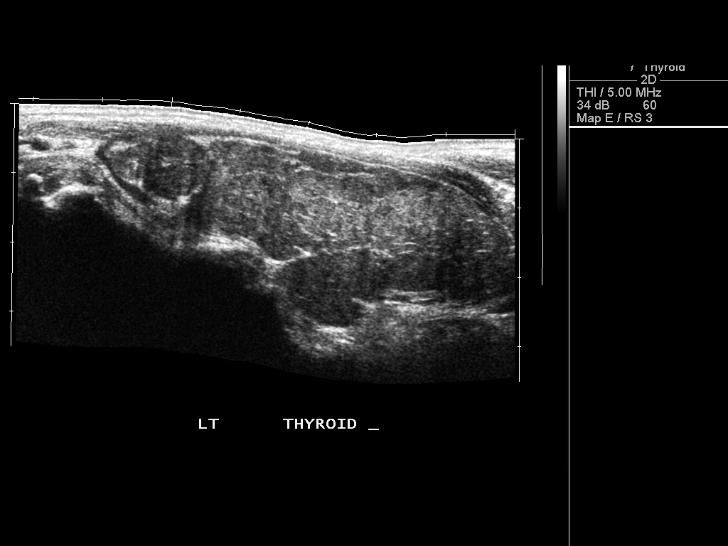
[im 39/47]
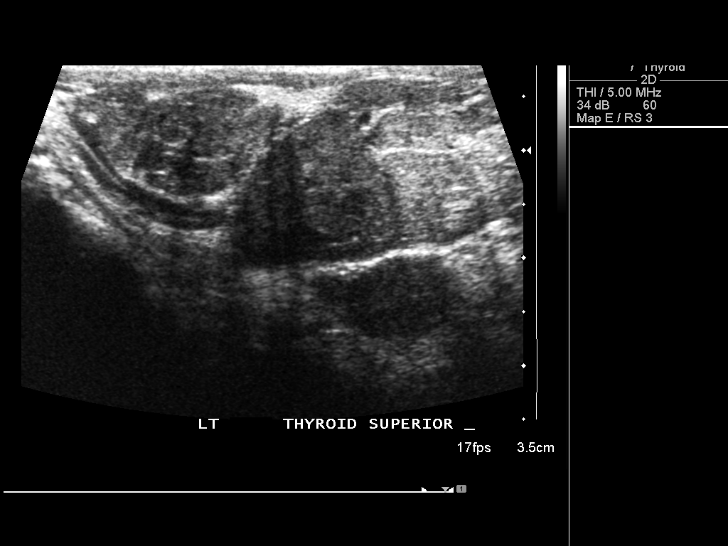
[im 43/47]
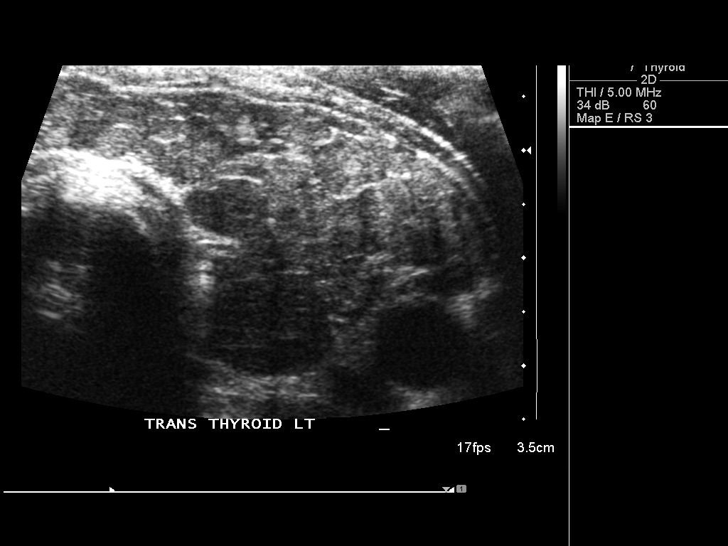
[im 47/47]
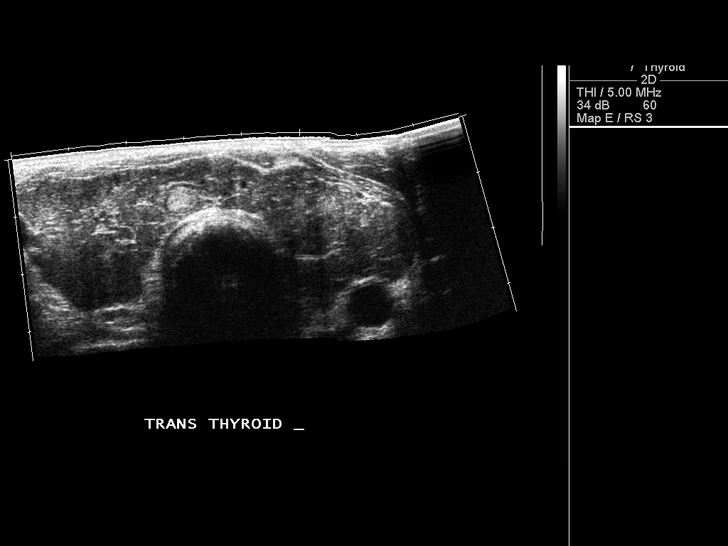

[14 of 25 positions shown; findings below may reference images not displayed]

FINDINGS: Right thyroid lobe

Measurements: 6.2 x 2.2 x 2.6 cm. Thyroid tissue is heterogeneous
and similar to the previous examination. There is a indistinct
hyperechoic area in the medial right thyroid lobe measuring up to
0.6 cm. There was a similar finding on the examination from 4711 but
this area is now more conspicuous.

Left thyroid lobe

Measurements: 6.3 x 2.5 x 3.0 cm. Thyroid tissue is diffusely
heterogeneous without a distinct nodule.

Isthmus

Thickness: 1.0 cm.  No nodules visualized.

Lymphadenopathy

None visualized.
IMPRESSION: Thyroid tissue is essentially stable. The thyroid tissue is
prominent for size and heterogeneous. There is a questionable nodule
versus pseudo nodule in the medial right thyroid lobe that has
minimally changed since [DATE].

## 2018-06-12 ENCOUNTER — Other Ambulatory Visit: Payer: Self-pay | Admitting: Endocrinology

## 2018-06-15 ENCOUNTER — Other Ambulatory Visit: Payer: Self-pay

## 2018-06-15 ENCOUNTER — Ambulatory Visit (INDEPENDENT_AMBULATORY_CARE_PROVIDER_SITE_OTHER): Payer: Managed Care, Other (non HMO) | Admitting: Endocrinology

## 2018-06-15 DIAGNOSIS — E039 Hypothyroidism, unspecified: Secondary | ICD-10-CM

## 2018-06-15 MED ORDER — LEVOTHYROXINE SODIUM 200 MCG PO TABS: 200.0000 ug | ORAL_TABLET | Freq: Every day | ORAL | 0 refills | Status: DC

## 2018-06-15 NOTE — Progress Notes (Signed)
Subjective:    Patient ID: Vincent Gray, male    DOB: 26-Mar-1960, 58 y.o.   MRN: [id number]  HPI  telehealth visit today via doxy video visit.  Alternatives to telehealth are presented to this patient, and the patient agrees to the telehealth visit. Pt is advised of the cost of the visit, and agrees to this, also.   Patient is at home, and I am at the office.   Persons attending the telehealth visit: the patient and I.   Pt returns for f/u of chronic primary hypothyroidism (dx'ed 1989; he has been on prescribed thyroid hormone therapy since then; he has required multiple med adjustments; therapy has been limited by noncompliance).  Pt says he has been off the synthroid x the past 3 days.  pt states he feels well in general.  He has gained a few lbs.   Past Medical History:  Diagnosis Date  . Arthritis     Past Surgical History:  Procedure Laterality Date  . KNEE CARTILAGE SURGERY    . SKIN GRAFT      Social History   Socioeconomic History  . Marital status: Single    Spouse name: Not on file  . Number of children: Not on file  . Years of education: Not on file  . Highest education level: Not on file  Occupational History  . Not on file  Social Needs  . Financial resource strain: Not on file  . Food insecurity:    Worry: Not on file    Inability: Not on file  . Transportation needs:    Medical: Not on file    Non-medical: Not on file  Tobacco Use  . Smoking status: Current Every Day Smoker    Packs/day: 1.00    Types: Cigarettes  . Smokeless tobacco: Never Used  Substance and Sexual Activity  . Alcohol use: No    Alcohol/week: 0.0 standard drinks  . Drug use: No  . Sexual activity: Not on file  Lifestyle  . Physical activity:    Days per week: Not on file    Minutes per session: Not on file  . Stress: Not on file  Relationships  . Social connections:    Talks on phone: Not on file    Gets together: Not on file    Attends religious service: Not on file     Active member of club or organization: Not on file    Attends meetings of clubs or organizations: Not on file    Relationship status: Not on file  . Intimate partner violence:    Fear of current or ex partner: Not on file    Emotionally abused: Not on file    Physically abused: Not on file    Forced sexual activity: Not on file  Other Topics Concern  . Not on file  Social History Narrative  . Not on file    Current Outpatient Medications on File Prior to Visit  Medication Sig Dispense Refill  . Artificial Tears 0.1-0.3 % SOLN Apply to eye daily as needed.     . diphenhydrAMINE (BENADRYL) 25 MG tablet Take 1 tablet (25 mg total) by mouth every 6 (six) hours. X 3 days, then as needed 20 tablet 0  . fluticasone (FLONASE) 50 MCG/ACT nasal spray Place into both nostrils daily as needed for allergies or rhinitis.    Marland Kitchen gabapentin (NEURONTIN) 300 MG capsule Take 600 mg by mouth 2 (two) times daily.    Marland Kitchen HYDROcodone-acetaminophen (NORCO) 10-325  MG per tablet Take 1 tablet by mouth every 6 (six) hours as needed. 15 tablet 0  . ibuprofen (ADVIL,MOTRIN) 200 MG tablet Take 600 mg by mouth every 6 (six) hours as needed.     No current facility-administered medications on file prior to visit.     Allergies  Allergen Reactions  . Acyclovir And Related Hives and Rash    Family History  Problem Relation Age of Onset  . Thyroid disease Mother   . Thyroid disease Sister    Review of Systems Denies dry skin and fatigue.      Objective:   Physical Exam      Assessment & Plan:  Hypothyroidism: due for recheck.  Noncompliance with medication.  We discussed.    Patient Instructions  blood tests are requested for you today.  We'll let you know about the results.   I have sent a prescription to your pharmacy, to refill the medication.   It is best to never miss the thyroid medication.   However, if you do miss a day, next best is to take 2 the next day.   Please come back for a follow-up  appointment in 6 months.

## 2018-06-15 NOTE — Patient Instructions (Addendum)
blood tests are requested for you today.  We'll let you know about the results.   I have sent a prescription to your pharmacy, to refill the medication.   It is best to never miss the thyroid medication.   However, if you do miss a day, next best is to take 2 the next day.   Please come back for a follow-up appointment in 6 months.

## 2018-07-13 ENCOUNTER — Other Ambulatory Visit: Payer: Self-pay

## 2018-07-13 ENCOUNTER — Other Ambulatory Visit (INDEPENDENT_AMBULATORY_CARE_PROVIDER_SITE_OTHER): Payer: Managed Care, Other (non HMO)

## 2018-07-13 DIAGNOSIS — E039 Hypothyroidism, unspecified: Secondary | ICD-10-CM

## 2018-07-13 LAB — T4, FREE: Free T4: 1.3 ng/dL (ref 0.60–1.60)

## 2018-07-13 LAB — TSH: TSH: 0.35 u[IU]/mL (ref 0.35–4.50)

## 2018-12-17 ENCOUNTER — Other Ambulatory Visit: Payer: Self-pay

## 2018-12-17 MED ORDER — LEVOTHYROXINE SODIUM 200 MCG PO TABS: 200.0000 ug | ORAL_TABLET | Freq: Every day | ORAL | 1 refills | Status: DC

## 2019-10-09 ENCOUNTER — Ambulatory Visit: Payer: Managed Care, Other (non HMO) | Admitting: Cardiology

## 2019-10-09 ENCOUNTER — Encounter: Payer: Self-pay | Admitting: Cardiology

## 2019-10-09 ENCOUNTER — Other Ambulatory Visit: Payer: Self-pay

## 2019-10-09 VITALS — BP 110/82 | HR 71 | Resp 16 | Ht 71.0 in | Wt 139.0 lb

## 2019-10-09 DIAGNOSIS — R0989 Other specified symptoms and signs involving the circulatory and respiratory systems: Secondary | ICD-10-CM

## 2019-10-09 DIAGNOSIS — F17200 Nicotine dependence, unspecified, uncomplicated: Secondary | ICD-10-CM

## 2019-10-09 DIAGNOSIS — R0609 Other forms of dyspnea: Secondary | ICD-10-CM

## 2019-10-09 DIAGNOSIS — E785 Hyperlipidemia, unspecified: Secondary | ICD-10-CM

## 2019-10-09 DIAGNOSIS — I739 Peripheral vascular disease, unspecified: Secondary | ICD-10-CM

## 2019-10-09 MED ORDER — ATORVASTATIN CALCIUM 20 MG PO TABS: 20.0000 mg | ORAL_TABLET | Freq: Every day | ORAL | 0 refills | Status: DC

## 2019-10-09 NOTE — Progress Notes (Signed)
Date:  10/09/2019   ID:  Vincent Gray, DOB [birth year], MRN [id number]  PCP:  Harvie Junior, MD  Cardiologist:  Rex Kras, DO, [facility] (established care 10/09/2019)  REASON FOR CONSULT: Carotid bruit  REQUESTING PHYSICIAN:  Harvie Junior, MD [address] [place],  [place] [address]  Chief Complaint  Patient presents with  . Carotid  . New Patient (Initial Visit)  . Shortness of Breath    HPI  Vincent Gray is a 59 y.o. male who presents to the office with a chief complaint of " evaluation of carotid bruit and dyspnea on exertion." He is referred to the office at the request of Harvie Junior, MD. Patient's past medical history and cardiovascular risk factors include: Dyslipidemia, hypothyroidism, active smoking.  Patient is referred to the office at the request of his primary care provider for evaluation of carotid bruit.  Patient is accompanied by his wife Hinton Dyer at today's office visit.  Patient states that he went for his yearly physical and was noted to have a left-sided carotid bruit and is sent to cardiology for further evaluation and management.  Patient states that he has been having lightheadedness at times but never realized it could be secondary to chronic disease.  He denies any near-syncope or syncopal events.  He states that he has never had a carotid duplex or a CT of the head and neck in the past.  He is currently not on aspirin or statin therapy.  Of note, patient also states that has been having dyspnea on exertion for the last several months.  He states that his symptoms are getting progressively worse.  He states that he could get short of breath with effort related activities walking from the office to the parking lot.  Patient states that his symptoms are improved with rest.  He has never had an echocardiogram and stress test in the past.  Patient states that he is not as active as he was couple years ago.  Patient denies any exertional chest  pain.  Patient states that he also has bilateral claudication is mostly located in his thighs with walking.  The symptoms usually improved with resting.  Continues to smoke at least 1 pack/day and has been doing so for the last 40 years.  Denies prior history of coronary artery disease, myocardial infarction, congestive heart failure, deep venous thrombosis, pulmonary embolism, stroke, transient ischemic attack.  FUNCTIONAL STATUS: He walks the dog about 0.25 mile once a day seven days a week.    ALLERGIES: Allergies  Allergen Reactions  . Acyclovir And Related Hives and Rash    MEDICATION LIST PRIOR TO VISIT: Current Meds  Medication Sig  . diphenhydrAMINE (BENADRYL) 25 MG tablet Take 1 tablet (25 mg total) by mouth every 6 (six) hours. X 3 days, then as needed  . gabapentin (NEURONTIN) 300 MG capsule Take 600 mg by mouth 2 (two) times daily.  Marland Kitchen HYDROcodone-acetaminophen (NORCO) 10-325 MG per tablet Take 1 tablet by mouth every 6 (six) hours as needed.  Marland Kitchen ibuprofen (ADVIL,MOTRIN) 200 MG tablet Take 600 mg by mouth every 6 (six) hours as needed.  Marland Kitchen levothyroxine (SYNTHROID) 200 MCG tablet Take 1 tablet (200 mcg total) by mouth daily. (Patient taking differently: Take 150 mcg by mouth daily. )     PAST MEDICAL HISTORY: Past Medical History:  Diagnosis Date  . Arthritis   . Chronic fatigue   . Hyperlipidemia   . Osteoarthritis of knee, unspecified   .  Sinusitis, chronic   . Thyroid disease     PAST SURGICAL HISTORY: Past Surgical History:  Procedure Laterality Date  . KNEE CARTILAGE SURGERY    . SKIN GRAFT      FAMILY HISTORY: The patient family history includes COPD in his sister, sister, and sister; Thyroid disease in his mother and sister; Transient ischemic attack in his father.  SOCIAL HISTORY:  The patient  reports that he has been smoking cigarettes. He has a 40.00 pack-year smoking history. He has never used smokeless tobacco. He reports previous alcohol use. He  reports that he does not use drugs.  REVIEW OF SYSTEMS: Review of Systems  Constitutional: Positive for malaise/fatigue. Negative for chills and fever.  HENT: Negative for hoarse voice and nosebleeds.   Eyes: Negative for discharge, double vision and pain.  Cardiovascular: Positive for claudication and dyspnea on exertion. Negative for chest pain, leg swelling, near-syncope, orthopnea, palpitations, paroxysmal nocturnal dyspnea and syncope.  Respiratory: Positive for shortness of breath. Negative for hemoptysis.   Musculoskeletal: Negative for muscle cramps and myalgias.  Gastrointestinal: Negative for abdominal pain, constipation, diarrhea, hematemesis, hematochezia, melena, nausea and vomiting.  Neurological: Negative for dizziness and light-headedness.    PHYSICAL EXAM: Vitals with BMI 10/09/2019 05/08/2017 05/18/2015  Height _0  - 5' 10.5"  Weight 139 lbs 148 lbs 129 lbs  BMI 58.8 - 50.2  Systolic 774 92 128  Diastolic 82 64 70  Pulse 71 83 68    CONSTITUTIONAL: Well-developed and well-nourished. No acute distress.  SKIN: Skin is warm and dry. No rash noted. No cyanosis. No pallor. No jaundice HEAD: Normocephalic and atraumatic.  EYES: No scleral icterus MOUTH/THROAT: Moist oral membranes.  NECK: No JVD present. No thyromegaly noted. + left carotid bruits  LYMPHATIC: No visible cervical adenopathy.  CHEST Normal respiratory effort. No intercostal retractions  LUNGS: Clear to auscultation bilaterally. No stridor. No wheezes. No rales.  CARDIOVASCULAR: Regular rate and rhythm, positive S1-S2, no murmurs rubs or gallops appreciated. ABDOMINAL: No apparent ascites.  EXTREMITIES: No peripheral edema. +2 bilateral femoral pulse, +1 bilateral popliteal pulse, 2+ RPT, faint RDP, faint left DP and PT.  HEMATOLOGIC: No significant bruising NEUROLOGIC: Oriented to person, place, and time. Nonfocal. Normal muscle tone.  PSYCHIATRIC: Normal mood and affect. Normal behavior.  Cooperative  CARDIAC DATABASE: EKG: 10/09/2019: Normal sinus rhythm, 93 bpm, normal axis, left atrial enlargement, nonspecific ST depressions, baseline artifact in multiple leads.  No prior ECGs available for comparison  Echocardiogram: None  Stress Testing: None  Heart Catheterization: None  LABORATORY DATA: Labs from 09/17/2019 reviewed (from PCP) Creatinine 0.80 mg/dL. eGFR: >60 mL/min per 1.73 m Lipid profile: Total cholesterol 169, triglycerides 146, HDL 30, LDL 110.  IMPRESSION:    ICD-10-CM   1. Left carotid bruit  R09.89 EKG 12-Lead    PCV CAROTID DUPLEX (BILATERAL)  2. Smoking  F17.200   3. Dyslipidemia  E78.5 atorvastatin (LIPITOR) 20 MG tablet  4. Dyspnea on exertion  R06.00 PCV ECHOCARDIOGRAM COMPLETE    PCV MYOCARDIAL PERFUSION WITH LEXISCAN  5. 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 male whose past medical history and cardiac risk factors include: Dyslipidemia, hypothyroidism, active smoking.  Left carotid bruit:  Check carotid duplex to evaluate for carotid artery atherosclerosis.  Patient does have underlying dyslipidemia and his estimated 10-year risk of ASCVD is greater than 10%.  Recommended Lipitor  20 mg p.o. nightly.  Patient is agreeable.  Dyspnea on exertion:  Patient has multiple cardiovascular risk factors for coronary artery disease.  Echocardiogram will be ordered to evaluate for structural heart disease and left ventricular systolic function.  Nuclear stress test recommended to evaluate for reversible ischemia.  Claudications in bilateral lower extremities:  His symptoms are suggestive of claudication.  Other risk factors include dyslipidemia and active smoking.  Patient will be scheduled for ankle-brachial index to evaluate for peripheral artery disease followed by arterial duplex.  Educated on importance of  complete smoking cessation.  Encouraged him to continue to walk as much as he can tolerate on a daily basis.  Independently reviewed outside records including PCPs office note and laboratory values.  Summarized laboratory values noted above.  FINAL MEDICATION LIST END OF ENCOUNTER: Meds ordered this encounter  Medications  . atorvastatin (LIPITOR) 20 MG tablet    Sig: Take 1 tablet (20 mg total) by mouth at bedtime.    Dispense:  90 tablet    Refill:  0    Medications Discontinued During This Encounter  Medication Reason  . Artificial Tears 0.1-0.3 % SOLN Patient Preference  . fluticasone (FLONASE) 50 MCG/ACT nasal spray Patient Preference     Current Outpatient Medications:  .  diphenhydrAMINE (BENADRYL) 25 MG tablet, Take 1 tablet (25 mg total) by mouth every 6 (six) hours. X 3 days, then as needed, Disp: 20 tablet, Rfl: 0 .  gabapentin (NEURONTIN) 300 MG capsule, Take 600 mg by mouth 2 (two) times daily., Disp: , Rfl:  .  HYDROcodone-acetaminophen (NORCO) 10-325 MG per tablet, Take 1 tablet by mouth every 6 (six) hours as needed., Disp: 15 tablet, Rfl: 0 .  ibuprofen (ADVIL,MOTRIN) 200 MG tablet, Take 600 mg by mouth every 6 (six) hours as needed., Disp: , Rfl:  .  levothyroxine (SYNTHROID) 200 MCG tablet, Take 1 tablet (200 mcg total) by mouth daily. (Patient taking differently: Take 150 mcg by mouth daily. ), Disp: 90 tablet, Rfl: 1 .  atorvastatin (LIPITOR) 20 MG tablet, Take 1 tablet (20 mg total) by mouth at bedtime., Disp: 90 tablet, Rfl: 0  Orders Placed This Encounter  Procedures  . PCV MYOCARDIAL PERFUSION WITH LEXISCAN  . EKG 12-Lead  . PCV ECHOCARDIOGRAM COMPLETE  . PCV CAROTID DUPLEX (BILATERAL)  . PCV AORTA DUPLEX  . PCV LOWER ARTERIAL (BILATERAL)    There are no Patient Instructions on file for this visit.   --Continue cardiac medications as reconciled in final medication list. --Return in about 4 weeks (around 11/06/2019) for review test results.. Or sooner  if needed. --Continue follow-up with your primary care physician regarding the management of your other chronic comorbid conditions.  Patient's questions and concerns were addressed to his satisfaction. He voices understanding of the instructions provided during this encounter.   This note was created using a voice recognition software as a result there may be grammatical errors inadvertently enclosed that do not reflect the nature of this encounter. Every attempt is made to correct such errors.  Rex Kras, [place], [facility]  Pager: [phone] Office: [phone]

## 2019-10-23 ENCOUNTER — Other Ambulatory Visit: Payer: Self-pay

## 2019-10-23 ENCOUNTER — Ambulatory Visit: Payer: Managed Care, Other (non HMO)

## 2019-10-23 DIAGNOSIS — R0609 Other forms of dyspnea: Secondary | ICD-10-CM

## 2019-11-01 ENCOUNTER — Other Ambulatory Visit: Payer: Self-pay

## 2019-11-01 ENCOUNTER — Ambulatory Visit: Payer: Managed Care, Other (non HMO)

## 2019-11-01 DIAGNOSIS — R0609 Other forms of dyspnea: Secondary | ICD-10-CM

## 2019-11-01 DIAGNOSIS — R0989 Other specified symptoms and signs involving the circulatory and respiratory systems: Secondary | ICD-10-CM

## 2019-11-01 DIAGNOSIS — I739 Peripheral vascular disease, unspecified: Secondary | ICD-10-CM

## 2019-11-04 NOTE — Progress Notes (Signed)
2nd attempt, spoke to patient and he is aware sch/rma

## 2019-11-04 NOTE — Progress Notes (Signed)
No answer left a vm will try again later sch/rma

## 2019-11-06 NOTE — Progress Notes (Signed)
Unable to reach patient. Left vm to cb.

## 2019-11-06 NOTE — Progress Notes (Signed)
Spoke with patient. Patient voiced understanding.

## 2019-11-19 ENCOUNTER — Other Ambulatory Visit: Payer: Self-pay

## 2019-11-19 ENCOUNTER — Ambulatory Visit: Payer: Managed Care, Other (non HMO) | Admitting: Cardiology

## 2019-11-19 ENCOUNTER — Encounter: Payer: Self-pay | Admitting: Cardiology

## 2019-11-19 VITALS — BP 112/68 | HR 80 | Ht 71.0 in | Wt 140.0 lb

## 2019-11-19 DIAGNOSIS — Z712 Person consulting for explanation of examination or test findings: Secondary | ICD-10-CM

## 2019-11-19 DIAGNOSIS — F17200 Nicotine dependence, unspecified, uncomplicated: Secondary | ICD-10-CM

## 2019-11-19 DIAGNOSIS — R0989 Other specified symptoms and signs involving the circulatory and respiratory systems: Secondary | ICD-10-CM

## 2019-11-19 DIAGNOSIS — E785 Hyperlipidemia, unspecified: Secondary | ICD-10-CM

## 2019-11-19 DIAGNOSIS — I6523 Occlusion and stenosis of bilateral carotid arteries: Secondary | ICD-10-CM

## 2019-11-19 NOTE — Progress Notes (Signed)
ID:  Vincent Gray, DOB 11-Jan-1961, MRN [id number]  PCP:  Harvie Junior, MD  Cardiologist:  Rex Kras, DO, [facility] (established care 10/09/2019)  Date: 11/19/2019 Last Office Visit: 10/09/2019  Chief Complaint  Patient presents with  . Carotid Bruit  . Follow-up    HPI  Vincent Gray is a 59 y.o. male who presents to the office with a chief complaint of " follow-up on carotid bruit and test results." Patient's past medical history and cardiovascular risk factors include: Dyslipidemia, hypothyroidism, active smoking, asymptomatic bilateral carotid artery stenosis.  Patient is referred to the office at the request of his primary care provider for evaluation of carotid bruit.  Patient is accompanied by his wife Hinton Dyer at today's office visit.  Patient states that he went for his yearly physical and was noted to have a left-sided carotid bruit and is sent to cardiology for further evaluation and management.  On the initial encounter patient did have a carotid bruit on physical examination and given his cholesterol levels was recommended to be on statin therapy.  He did undergo carotid duplex since last visit and he is noted to have 50-69% stenosis bilaterally within the internal carotid arteries.    In addition, at the last visit he also complained of symptoms of dyspnea on exertion. Therefore was recommended to undergo echocardiogram and stress test.  The echocardiogram noted preserved LVEF, normal diastolic filling pattern, without any significant valvular heart disease.  And stress test noted to be overall low risk study.  In regards to symptoms patient states that his dyspnea on exertion is now resolved.    Given his underlying hyperlipidemia and significant tobacco use he also underwent abdominal aortic duplex.  He is noted to have moderate plaque within the abdominal aorta without obvious evidence of AAA.  Given his symptoms of claudication and reduced pulses on physical  examination he also underwent lower extremity arterial duplex.  Results reviewed with both the patient and his wife.  ABIs are within normal limits suggesting normal perfusion and no hemodynamically significant stenosis identified.  Since last office visit patient continues to smoke but states that he has reduced the number of cigarettes per day.  Denies prior history of myocardial infarction, congestive heart failure, deep venous thrombosis, pulmonary embolism, stroke, transient ischemic attack.  FUNCTIONAL STATUS: He walks the dog about 0.25 mile once a day seven days a week.    ALLERGIES: Allergies  Allergen Reactions  . Acyclovir And Related Hives and Rash    MEDICATION LIST PRIOR TO VISIT: Current Meds  Medication Sig  . aspirin EC 81 MG tablet Take 81 mg by mouth daily. Swallow whole.  Marland Kitchen atorvastatin (LIPITOR) 20 MG tablet Take 1 tablet (20 mg total) by mouth at bedtime.  . gabapentin (NEURONTIN) 300 MG capsule Take 600 mg by mouth 2 (two) times daily.  Marland Kitchen HYDROcodone-acetaminophen (NORCO) 10-325 MG per tablet Take 1 tablet by mouth every 6 (six) hours as needed.  Marland Kitchen ibuprofen (ADVIL,MOTRIN) 200 MG tablet Take 600 mg by mouth every 6 (six) hours as needed.  Marland Kitchen levothyroxine (SYNTHROID) 150 MCG tablet Take 150 mcg by mouth every morning.     PAST MEDICAL HISTORY: Past Medical History:  Diagnosis Date  . Arthritis   . Carotid artery stenosis, asymptomatic, bilateral   . Chronic fatigue   . Hyperlipidemia   . Osteoarthritis of knee, unspecified   . Sinusitis, chronic   . Thyroid disease     PAST SURGICAL HISTORY: Past Surgical History:  Procedure Laterality Date  . KNEE CARTILAGE SURGERY    . SKIN GRAFT      FAMILY HISTORY: The patient family history includes COPD in his sister, sister, and sister; Thyroid disease in his mother and sister; Transient ischemic attack in his father.  SOCIAL HISTORY:  The patient  reports that he has been smoking cigarettes. He has a  40.00 pack-year smoking history. He has never used smokeless tobacco. He reports previous alcohol use. He reports that he does not use drugs.  REVIEW OF SYSTEMS: Review of Systems  Constitutional: Negative for chills, fever and malaise/fatigue.  HENT: Negative for hoarse voice and nosebleeds.   Eyes: Negative for discharge, double vision and pain.  Cardiovascular: Negative for chest pain, claudication, dyspnea on exertion, leg swelling, near-syncope, orthopnea, palpitations, paroxysmal nocturnal dyspnea and syncope.  Respiratory: Negative for hemoptysis and shortness of breath.   Musculoskeletal: Negative for muscle cramps and myalgias.  Gastrointestinal: Negative for abdominal pain, constipation, diarrhea, hematemesis, hematochezia, melena, nausea and vomiting.  Neurological: Negative for dizziness and light-headedness.    PHYSICAL EXAM: Vitals with BMI 11/19/2019 10/09/2019 05/08/2017  Height $Remov'5\' 11"'XKOMDj$  $RemoveB'5\' 11"'lAqEioBN$  -  Weight 140 lbs 139 lbs 148 lbs  BMI 16.96 78.9 -  Systolic 381 017 92  Diastolic 68 82 64  Pulse 80 71 83    CONSTITUTIONAL: Well-developed and well-nourished. No acute distress.  SKIN: Skin is warm and dry. No rash noted. No cyanosis. No pallor. No jaundice HEAD: Normocephalic and atraumatic.  EYES: No scleral icterus MOUTH/THROAT: Moist oral membranes.  NECK: No JVD present. No thyromegaly noted. + left carotid bruits  LYMPHATIC: No visible cervical adenopathy.  CHEST Normal respiratory effort. No intercostal retractions  LUNGS: Clear to auscultation bilaterally. No stridor. No wheezes. No rales.  CARDIOVASCULAR: Regular rate and rhythm, positive S1-S2, no murmurs rubs or gallops appreciated. ABDOMINAL: No apparent ascites.  EXTREMITIES: No peripheral edema. +2 bilateral femoral pulse, +1 bilateral popliteal pulse, 2+ RPT, faint RDP, faint left DP and PT.  HEMATOLOGIC: No significant bruising NEUROLOGIC: Oriented to person, place, and time. Nonfocal. Normal muscle tone.   PSYCHIATRIC: Normal mood and affect. Normal behavior. Cooperative  CARDIAC DATABASE: EKG: 10/09/2019: Normal sinus rhythm, 93 bpm, normal axis, left atrial enlargement, nonspecific ST depressions, baseline artifact in multiple leads.  No prior ECGs available for comparison  Echocardiogram: 11/01/2019:  Left ventricle cavity is normal in size and wall thickness. Normal LV systolic function with EF 56%. Normal global wall motion. Normal diastolic filling pattern.  Mild (Grade I) mitral regurgitation.  IVC is dilated with blunted respiratory response. This may suggest elevated right heart pressure   Stress Testing: Lexiscan Sestamibi Stress Test 10/24/2019:  Nondiagnostic ECG stress. Resting EKG sinus rhythm with short PR interval. Peak EKG/ECG revealed no ST-T wave abnormalities with Lexiscan infusion.  Myocardial perfusion is normal.  Overall LV systolic function is normal without regional wall motion abnormalities. Stress LV EF: 57%.  No previous exam available for comparison. Low risk.   Heart Catheterization: None  Abdominal Aortic Duplex 11/01/2019:  Moderate plaque noted in the abdominal aorta.  Mild ectasia noted in the distal abdominal aorta and measures 2.32 cm.  Bilateral internal iliac arteries demonstrate > 50% stenosis.   Consider rescan in 5-10 years to exclude aneurysm formation.   Carotid artery duplex 11/01/2019:  Stenosis in the right internal carotid artery (50-69%). Stenosis in the right external carotid artery (<50%).  Stenosis in the left internal carotid artery (50-69%).  The right vertebral artery waveform demonstrates a  retrograde flow pattern and suggests proximal right subclavian artery stenosis.  Antegrade left vertebral artery flow.  Follow up in six months is appropriate if clinically indicated.   Lower Extremity Arterial Duplex 11/01/2019:  No hemodynamically significant stenoses are identified in the bilateral lower extremity arterial system.  Multiphasic waveform noted (biphasic) throughout the bilateral arterial system suggests mild diffuse disease.  This exam reveals normal perfusion of the right lower extremity (ABI 1.2) and normal perfusion of the left lower extremity (ABI 1.03).   LABORATORY DATA: Labs from 09/17/2019 reviewed (from PCP) Creatinine 0.80 mg/dL. eGFR: >60 mL/min per 1.73 m Lipid profile: Total cholesterol 169, triglycerides 146, HDL 30, LDL 110.  IMPRESSION:    ICD-10-CM   1. Bilateral carotid artery stenosis  I65.23 PCV CAROTID DUPLEX (BILATERAL)  2. Smoking  F17.200   3. Dyslipidemia  E78.5   4. Left carotid bruit  R09.89   5. Encounter to discuss test results  Z71.2      RECOMMENDATIONS: Vincent Gray is a 59 y.o. male whose past medical history and cardiac risk factors include: Dyslipidemia, hypothyroidism, active smoking.  Bilateral asymptomatic carotid stenosis:   Carotid duplex results reviewed with the patient and his wife at today's office visit.  Would continue to recommend aspirin 81 mg p.o. daily and statin therapy.    Repeat study scheduled for 41-month to reevaluate progression of disease.  Smoking cessation strongly encouraged.  Active smoking:  Approximately 40-year pack history of smoking thus far.  Given the findings of carotid artery stenosis spent 10 minutes discussing the benefits of complete smoking cessation.  The patient was counseled on the dangers of tobacco use, and was advised to quit.  Reviewed strategies to maximize success, including removing cigarettes and smoking materials from environment, stress management, substitution of other forms of reinforcement and support of family/friends.  Independently reviewed the results of the echocardiogram, stress test, carotid duplex, lower extremity arterial duplex, and abdominal aortic duplex.  Results were discussed with both the patient and his wife at today's office visit and explained in layman's terms.  Questions or  concerns were addressed to their satisfaction.  Total time spent: 31 minutes.  FINAL MEDICATION LIST END OF ENCOUNTER: No orders of the defined types were placed in this encounter.    Current Outpatient Medications:  .  aspirin EC 81 MG tablet, Take 81 mg by mouth daily. Swallow whole., Disp: , Rfl:  .  atorvastatin (LIPITOR) 20 MG tablet, Take 1 tablet (20 mg total) by mouth at bedtime., Disp: 90 tablet, Rfl: 0 .  gabapentin (NEURONTIN) 300 MG capsule, Take 600 mg by mouth 2 (two) times daily., Disp: , Rfl:  .  HYDROcodone-acetaminophen (NORCO) 10-325 MG per tablet, Take 1 tablet by mouth every 6 (six) hours as needed., Disp: 15 tablet, Rfl: 0 .  ibuprofen (ADVIL,MOTRIN) 200 MG tablet, Take 600 mg by mouth every 6 (six) hours as needed., Disp: , Rfl:  .  levothyroxine (SYNTHROID) 150 MCG tablet, Take 150 mcg by mouth every morning., Disp: , Rfl:   Orders Placed This Encounter  Procedures  . PCV CAROTID DUPLEX (BILATERAL)    There are no Patient Instructions on file for this visit.   --Continue cardiac medications as reconciled in final medication list. --Return in about 6 months (around 05/29/2020) for Reevaluation of carotid disease and review carotid duplex results.. Or sooner if needed. --Continue follow-up with your primary care physician regarding the management of your other chronic comorbid conditions.  Patient's questions and concerns were  addressed to his satisfaction. He voices understanding of the instructions provided during this encounter.   This note was created using a voice recognition software as a result there may be grammatical errors inadvertently enclosed that do not reflect the nature of this encounter. Every attempt is made to correct such errors.  Rex Kras, [place], [facility]  Pager: [phone] Office: [phone]

## 2020-01-21 ENCOUNTER — Other Ambulatory Visit: Payer: Self-pay | Admitting: Cardiology

## 2020-01-21 DIAGNOSIS — E785 Hyperlipidemia, unspecified: Secondary | ICD-10-CM

## 2020-05-19 ENCOUNTER — Other Ambulatory Visit: Payer: Managed Care, Other (non HMO)

## 2020-06-04 ENCOUNTER — Ambulatory Visit: Payer: Managed Care, Other (non HMO) | Admitting: Cardiology

## 2020-06-19 ENCOUNTER — Other Ambulatory Visit: Payer: Self-pay | Admitting: Cardiology

## 2020-06-19 DIAGNOSIS — E785 Hyperlipidemia, unspecified: Secondary | ICD-10-CM

## 2020-06-23 ENCOUNTER — Ambulatory Visit: Payer: Managed Care, Other (non HMO)

## 2020-06-23 ENCOUNTER — Other Ambulatory Visit: Payer: Self-pay

## 2020-06-24 NOTE — Progress Notes (Signed)
No answer left a vm to call back

## 2020-06-24 NOTE — Progress Notes (Signed)
Patient called back he is aware

## 2020-07-14 ENCOUNTER — Ambulatory Visit: Payer: Managed Care, Other (non HMO) | Admitting: Cardiology

## 2020-07-14 ENCOUNTER — Encounter: Payer: Self-pay | Admitting: Cardiology

## 2020-07-14 ENCOUNTER — Other Ambulatory Visit: Payer: Self-pay

## 2020-07-14 VITALS — BP 102/78 | HR 86 | Temp 98.6°F | Resp 16 | Ht 71.0 in | Wt 151.4 lb

## 2020-07-14 DIAGNOSIS — I6523 Occlusion and stenosis of bilateral carotid arteries: Secondary | ICD-10-CM

## 2020-07-14 DIAGNOSIS — E785 Hyperlipidemia, unspecified: Secondary | ICD-10-CM

## 2020-07-14 DIAGNOSIS — F17200 Nicotine dependence, unspecified, uncomplicated: Secondary | ICD-10-CM

## 2020-07-14 MED ORDER — ATORVASTATIN CALCIUM 40 MG PO TABS: 40.0000 mg | ORAL_TABLET | Freq: Every day | ORAL | 0 refills | Status: DC

## 2020-07-14 NOTE — Progress Notes (Signed)
ID:  Vincent Gray, DOB 1960-12-06, MRN [id number]  PCP:  Vincent Junior, MD  Cardiologist:  Vincent Kras, DO, [facility] (established care 10/09/2019)  Date: 07/14/20 Last Office Visit: 11/19/2019  Chief Complaint  Patient presents with  . Follow-up  . Results    review carotid duplex results  . Bilateral carotid artery stenosis    HPI  Vincent Gray is a 60 y.o. male who presents to the office with a chief complaint of " 60-monthfollow-up for carotid artery disease." Patient's past medical history and cardiovascular risk factors include: Dyslipidemia, hypothyroidism, active smoking, asymptomatic bilateral carotid artery stenosis.  Patient is referred to the office at the request of his primary care provider for evaluation of carotid bruit.    Patient presents today for 653-monthollow-up to reevaluate his carotid artery disease.  He was noted to have bilateral carotid artery stenosis at the last office visit and started on aspirin and statin therapy.  He is tolerating medical therapy well.  He has had a carotid duplex since last office visit which notes improvement in the right ICA stenosis but the left ICA stenosis remains relatively stable.  He has retrograde flow in the right vertebral artery suggestive of possible subclavian artery stenosis however he denies any symptoms with overhead activities, lightheadedness, dizziness.  He continues to smoke three fourths of a pack per day.  In the past he used to smoke 1-1.5 packs/day.  Patient states that he is motivated to stop smoking but still needs additional time.  FUNCTIONAL STATUS: He walks the dog about 0.25 mile once a day seven days a week.    ALLERGIES: Allergies  Allergen Reactions  . Acyclovir And Related Hives and Rash    MEDICATION LIST PRIOR TO VISIT: Current Meds  Medication Sig  . aspirin EC 81 MG tablet Take 81 mg by mouth daily. Swallow whole.  . Marland Kitchentorvastatin (LIPITOR) 40 MG tablet Take 1 tablet (40 mg  total) by mouth at bedtime.  . gabapentin (NEURONTIN) 300 MG capsule Take 600 mg by mouth 2 (two) times daily.  . Marland KitchenYDROcodone-acetaminophen (NORCO) 10-325 MG per tablet Take 1 tablet by mouth every 6 (six) hours as needed.  . Marland Kitchenbuprofen (ADVIL,MOTRIN) 200 MG tablet Take 600 mg by mouth every 6 (six) hours as needed.  . Marland Kitchenevothyroxine (SYNTHROID) 150 MCG tablet Take 150 mcg by mouth every morning.  . [DISCONTINUED] atorvastatin (LIPITOR) 20 MG tablet TAKE 1 TABLET BY MOUTH EVERY NIGHT AT BEDTIME FOR CHOLESTEROL     PAST MEDICAL HISTORY: Past Medical History:  Diagnosis Date  . Arthritis   . Carotid artery stenosis, asymptomatic, bilateral   . Chronic fatigue   . Hyperlipidemia   . Osteoarthritis of knee, unspecified   . Sinusitis, chronic   . Thyroid disease     PAST SURGICAL HISTORY: Past Surgical History:  Procedure Laterality Date  . KNEE CARTILAGE SURGERY    . SKIN GRAFT      FAMILY HISTORY: The patient family history includes COPD in his sister, sister, and sister; Thyroid disease in his mother and sister; Transient ischemic attack in his father.  SOCIAL HISTORY:  The patient  reports that he has been smoking cigarettes. He has a 20.00 pack-year smoking history. He has never used smokeless tobacco. He reports previous alcohol use. He reports that he does not use drugs.  REVIEW OF SYSTEMS: Review of Systems  Constitutional: Negative for chills and fever.  HENT: Negative for hoarse voice and nosebleeds.   Eyes: Negative  for discharge, double vision and pain.  Cardiovascular: Negative for chest pain, claudication, dyspnea on exertion, leg swelling, near-syncope, orthopnea, palpitations, paroxysmal nocturnal dyspnea and syncope.  Respiratory: Negative for hemoptysis and shortness of breath.   Musculoskeletal: Negative for muscle cramps and myalgias.  Gastrointestinal: Negative for abdominal pain, constipation, diarrhea, hematemesis, hematochezia, melena, nausea and vomiting.   Neurological: Negative for dizziness and light-headedness.   PHYSICAL EXAM: Vitals with BMI 07/14/2020 07/14/2020 07/14/2020  Height - - _0   Weight - - 151 lbs 6 oz  BMI - - 53.97  Systolic 673 98 96  Diastolic 78 68 70  Pulse 86 84 90    CONSTITUTIONAL: Well-developed and well-nourished. No acute distress.  SKIN: Skin is warm and dry. No rash noted. No cyanosis. No pallor. No jaundice HEAD: Normocephalic and atraumatic.  EYES: No scleral icterus MOUTH/THROAT: Moist oral membranes.  NECK: No JVD present. No thyromegaly noted. + left carotid bruits  LYMPHATIC: No visible cervical adenopathy.  CHEST Normal respiratory effort. No intercostal retractions  LUNGS: Clear to auscultation bilaterally. No stridor. No wheezes. No rales.  CARDIOVASCULAR: Regular rate and rhythm, positive S1-S2, no murmurs rubs or gallops appreciated. ABDOMINAL: No apparent ascites.  EXTREMITIES: No peripheral edema. +2 bilateral femoral pulse, +1 bilateral popliteal pulse, 2+ RPT, faint RDP, faint left DP and PT.  HEMATOLOGIC: No significant bruising NEUROLOGIC: Oriented to person, place, and time. Nonfocal. Normal muscle tone.  PSYCHIATRIC: Normal mood and affect. Normal behavior. Cooperative  CARDIAC DATABASE: EKG: 07/14/2020: Normal sinus rhythm, 82 bpm, normal axis, without underlying ischemia or injury pattern.    Echocardiogram: 11/01/2019:  Left ventricle cavity is normal in size and wall thickness. Normal LV systolic function with EF 56%. Normal global wall motion. Normal diastolic filling pattern.  Mild (Grade I) mitral regurgitation.  IVC is dilated with blunted respiratory response. This may suggest elevated right heart pressure   Stress Testing: Lexiscan Sestamibi Stress Test 10/24/2019:  Nondiagnostic ECG stress. Resting EKG sinus rhythm with short PR interval. Peak EKG/ECG revealed no ST-T wave abnormalities with Lexiscan infusion.  Myocardial perfusion is normal.  Overall LV systolic  function is normal without regional wall motion abnormalities. Stress LV EF: 57%.  No previous exam available for comparison. Low risk.   Heart Catheterization: None  Abdominal Aortic Duplex 11/01/2019:  Moderate plaque noted in the abdominal aorta.  Mild ectasia noted in the distal abdominal aorta and measures 2.32 cm.  Bilateral internal iliac arteries demonstrate > 50% stenosis.   Consider rescan in 5-10 years to exclude aneurysm formation.   Carotid artery duplex 11/01/2019:  Stenosis in the right internal carotid artery (50-69%). Stenosis in the right external carotid artery (<50%).  Stenosis in the left internal carotid artery (50-69%).  The right vertebral artery waveform demonstrates a retrograde flow pattern and suggests proximal right subclavian artery stenosis.  Antegrade left vertebral artery flow.  Follow up in six months is appropriate if clinically indicated.   06/23/2020: Stenosis in the right internal carotid artery (16-49%). Stenosis in the right external carotid artery (<50%). Stenosis in the left internal carotid artery (50-69%). Stenosis in the left external carotid artery (<50%). There is homogeneous plaque in bilateral carotid arteries. The right vertebral artery waveform demonstrates a retrograde flow pattern and suggests proximal right subclavian artery stenosis. Antegrade left vertebral artery flow. No significant change since 11/01/2019. Follow up in six months is appropriate if clinically indicated.  Lower Extremity Arterial Duplex 11/01/2019:  No hemodynamically significant stenoses are identified in the bilateral lower extremity  arterial system. Multiphasic waveform noted (biphasic) throughout the bilateral arterial system suggests mild diffuse disease.  This exam reveals normal perfusion of the right lower extremity (ABI 1.2) and normal perfusion of the left lower extremity (ABI 1.03).   LABORATORY DATA: Labs from 09/17/2019 reviewed (from  PCP) Creatinine 0.80 mg/dL. eGFR: >60 mL/min per 1.73 m Lipid profile: Total cholesterol 169, triglycerides 146, HDL 30, LDL 110.  Lipid profile: Collected: 06/04/2020 Total cholesterol 164, HDL 47, triglycerides 124, LDL 94, non-HDL 117.  IMPRESSION:    ICD-10-CM   1. Bilateral carotid artery stenosis  I65.23 EKG 12-Lead    atorvastatin (LIPITOR) 40 MG tablet    PCV CAROTID DUPLEX (BILATERAL)  2. Smoking  F17.200   3. Dyslipidemia  E78.5      RECOMMENDATIONS: Vincent Gray is a 60 y.o. male whose past medical history and cardiac risk factors include: Dyslipidemia, bilateral carotid artery stenosis,  Hypothyroidism, active smoking.  Bilateral asymptomatic carotid stenosis:   Carotid duplex results reviewed with the patient  RICA stenosis has improved compared to prior studies.   LICA stenosis remains stable.   Continue ASA and statin.   Denies right arm claudication, lightheadedness, dizziness.  Repeat study scheduled for 25-monthto reevaluate progression of disease.  If the disease burden remains stable will recommend yearly carotid duplex.  Most recent lipid profile independently reviewed.  LDL improved but not at goal.  Increase Lipitor to 40 mg p.o. nightly addressed.  Smoking cessation strongly encouraged.  Active smoking:  Approximately 40-year pack history of smoking thus far.  Currently smokes three fourths of a pack per day.  Given the findings of carotid artery stenosis spent 10 minutes discussing the benefits of complete smoking cessation.  The patient was counseled on the dangers of tobacco use, and was advised to quit.  Reviewed strategies to maximize success, including removing cigarettes and smoking materials from environment, stress management, substitution of other forms of reinforcement and support of family/friends.  Dyslipidemia:  Most recent lipid profile independently reviewed.  Recommend up titration of Lipitor to 40 mg p.o.  nightly.  He will call the office if he is able to tolerate the higher dose for another prescription.  He has a lipid profile checked with his PCP on an annual basis.  He is asked to bring in records at the next visit as well.  Total time spent: 35 minutes.  FINAL MEDICATION LIST END OF ENCOUNTER: Meds ordered this encounter  Medications  . atorvastatin (LIPITOR) 40 MG tablet    Sig: Take 1 tablet (40 mg total) by mouth at bedtime.    Dispense:  90 tablet    Refill:  0     Current Outpatient Medications:  .  aspirin EC 81 MG tablet, Take 81 mg by mouth daily. Swallow whole., Disp: , Rfl:  .  atorvastatin (LIPITOR) 40 MG tablet, Take 1 tablet (40 mg total) by mouth at bedtime., Disp: 90 tablet, Rfl: 0 .  gabapentin (NEURONTIN) 300 MG capsule, Take 600 mg by mouth 2 (two) times daily., Disp: , Rfl:  .  HYDROcodone-acetaminophen (NORCO) 10-325 MG per tablet, Take 1 tablet by mouth every 6 (six) hours as needed., Disp: 15 tablet, Rfl: 0 .  ibuprofen (ADVIL,MOTRIN) 200 MG tablet, Take 600 mg by mouth every 6 (six) hours as needed., Disp: , Rfl:  .  levothyroxine (SYNTHROID) 150 MCG tablet, Take 150 mcg by mouth every morning., Disp: , Rfl:   Orders Placed This Encounter  Procedures  . EKG 12-Lead  .  PCV CAROTID DUPLEX (BILATERAL)    There are no Patient Instructions on file for this visit.   --Continue cardiac medications as reconciled in final medication list. --Return in about 6 months (around 01/21/2021) for Follow up carotid artery stenosis.. Or sooner if needed. --Continue follow-up with your primary care physician regarding the management of your other chronic comorbid conditions.  Patient's questions and concerns were addressed to his satisfaction. He voices understanding of the instructions provided during this encounter.   This note was created using a voice recognition software as a result there may be grammatical errors inadvertently enclosed that do not reflect the nature  of this encounter. Every attempt is made to correct such errors.  Vincent Gray, [place], [facility]  Pager: [phone] Office: [phone]

## 2020-09-07 ENCOUNTER — Other Ambulatory Visit: Payer: Self-pay | Admitting: Cardiology

## 2020-12-30 ENCOUNTER — Other Ambulatory Visit: Payer: Self-pay | Admitting: Cardiology

## 2021-01-04 ENCOUNTER — Other Ambulatory Visit: Payer: Managed Care, Other (non HMO)

## 2021-01-14 ENCOUNTER — Ambulatory Visit: Payer: Managed Care, Other (non HMO) | Admitting: Cardiology

## 2021-03-17 ENCOUNTER — Other Ambulatory Visit: Payer: Managed Care, Other (non HMO)

## 2021-03-26 ENCOUNTER — Ambulatory Visit: Payer: Managed Care, Other (non HMO) | Admitting: Cardiology

## 2021-07-16 ENCOUNTER — Other Ambulatory Visit: Payer: Self-pay | Admitting: Cardiology

## 2021-07-16 DIAGNOSIS — I6523 Occlusion and stenosis of bilateral carotid arteries: Secondary | ICD-10-CM

## 2021-09-24 ENCOUNTER — Ambulatory Visit: Payer: Managed Care, Other (non HMO)

## 2021-09-24 DIAGNOSIS — I6523 Occlusion and stenosis of bilateral carotid arteries: Secondary | ICD-10-CM

## 2021-09-29 ENCOUNTER — Ambulatory Visit: Payer: Managed Care, Other (non HMO) | Admitting: Cardiology

## 2021-10-01 NOTE — Progress Notes (Signed)
Spoke with patient and advised him about the message above as well as his follow up appt for 10/12/21. Patient expressed understanding

## 2021-10-12 ENCOUNTER — Ambulatory Visit: Payer: Managed Care, Other (non HMO) | Admitting: Cardiology

## 2021-10-12 ENCOUNTER — Encounter: Payer: Self-pay | Admitting: Cardiology

## 2021-10-12 VITALS — BP 90/64 | HR 86 | Temp 98.5°F | Resp 16 | Ht 71.0 in | Wt 132.0 lb

## 2021-10-12 DIAGNOSIS — I6523 Occlusion and stenosis of bilateral carotid arteries: Secondary | ICD-10-CM

## 2021-10-12 DIAGNOSIS — F17200 Nicotine dependence, unspecified, uncomplicated: Secondary | ICD-10-CM

## 2021-10-12 DIAGNOSIS — E785 Hyperlipidemia, unspecified: Secondary | ICD-10-CM

## 2021-10-12 NOTE — Progress Notes (Signed)
ID:  ASER NYLUND, DOB [birth year], MRN [id number]  PCP:  Harvie Junior, MD  Cardiologist:  Rex Kras, DO, [facility] (established care 10/09/2019)  Date: 10/12/21 Last Office Visit: 07/14/2020  Chief Complaint  Patient presents with   Carotid Artery Stenosis   Follow-up    6 months    HPI  Vincent Gray is a 61 y.o. male whose past medical history and cardiovascular risk factors include: Dyslipidemia, hypothyroidism, active smoking, asymptomatic bilateral carotid artery stenosis.  Patient is being followed by the practice for asymptomatic carotid artery disease.  He is noted to have bilateral carotid artery stenosis and since last duplex disease remains relatively stable.  He remains on aspirin and statin therapy without any side effects or intolerances.  He has significantly cut down the amount of cigarette smoking.  He used to smoke 1-1.5 packs/day and is down to less than 0.5 packs/day.   FUNCTIONAL STATUS: He walks the dog about 0.25 mile once a day seven days a week.    ALLERGIES: Allergies  Allergen Reactions   Acyclovir And Related Hives and Rash    MEDICATION LIST PRIOR TO VISIT: Current Meds  Medication Sig   aspirin EC 81 MG tablet Take 81 mg by mouth daily. Swallow whole.   atorvastatin (LIPITOR) 40 MG tablet Take 1 tablet (40 mg total) by mouth at bedtime.   gabapentin (NEURONTIN) 300 MG capsule Take 600 mg by mouth 2 (two) times daily.   HYDROcodone-acetaminophen (NORCO) 10-325 MG per tablet Take 1 tablet by mouth every 6 (six) hours as needed.   levothyroxine (SYNTHROID) 150 MCG tablet Take 150 mcg by mouth every morning.     PAST MEDICAL HISTORY: Past Medical History:  Diagnosis Date   Arthritis    Carotid artery stenosis, asymptomatic, bilateral    Chronic fatigue    Hyperlipidemia    Osteoarthritis of knee, unspecified    Sinusitis, chronic    Thyroid disease     PAST SURGICAL HISTORY: Past Surgical History:  Procedure Laterality Date    KNEE CARTILAGE SURGERY     SKIN GRAFT      FAMILY HISTORY: The patient family history includes COPD in his sister, sister, and sister; Thyroid disease in his mother and sister; Transient ischemic attack in his father.  SOCIAL HISTORY:  The patient  reports that he has been smoking cigarettes. He has a 20.00 pack-year smoking history. He has never used smokeless tobacco. He reports that he does not currently use alcohol. He reports that he does not use drugs.  REVIEW OF SYSTEMS: Review of Systems  Constitutional: Negative for chills and fever.  HENT:  Negative for hoarse voice and nosebleeds.   Eyes:  Negative for discharge, double vision and pain.  Cardiovascular:  Negative for chest pain, claudication, dyspnea on exertion, leg swelling, near-syncope, orthopnea, palpitations, paroxysmal nocturnal dyspnea and syncope.  Respiratory:  Negative for hemoptysis and shortness of breath.   Musculoskeletal:  Negative for muscle cramps and myalgias.  Gastrointestinal:  Negative for abdominal pain, constipation, diarrhea, hematemesis, hematochezia, melena, nausea and vomiting.  Neurological:  Negative for dizziness and light-headedness.   PHYSICAL EXAM:    10/12/2021    2:25 PM 07/14/2020   10:44 AM 07/14/2020   10:43 AM  Vitals with BMI  Height $Remov'5\' 11"'HkNydm$     Weight 132 lbs    BMI 78.46    Systolic 90 962 98  Diastolic 64 78 68  Pulse 86 86 84    CONSTITUTIONAL: Well-developed and well-nourished.  No acute distress.  SKIN: Skin is warm and dry. No rash noted. No cyanosis. No pallor. No jaundice HEAD: Normocephalic and atraumatic.  EYES: No scleral icterus MOUTH/THROAT: Moist oral membranes.  NECK: No JVD present. No thyromegaly noted. + left carotid bruits  CHEST Normal respiratory effort. No intercostal retractions  LUNGS: Clear to auscultation bilaterally. No stridor. No wheezes. No rales.  CARDIOVASCULAR: Regular rate and rhythm, positive S1-S2, no murmurs rubs or gallops  appreciated. ABDOMINAL: No apparent ascites.  EXTREMITIES: No peripheral edema. +2 bilateral femoral pulse, +1 bilateral popliteal pulse, 2+ RPT, faint RDP, faint left DP and PT.  HEMATOLOGIC: No significant bruising NEUROLOGIC: Oriented to person, place, and time. Nonfocal. Normal muscle tone.  PSYCHIATRIC: Normal mood and affect. Normal behavior. Cooperative  CARDIAC DATABASE: EKG: 10/12/21 Normal sinus rhythm, 77 bpm, normal axis, without underlying injury pattern  Echocardiogram: 11/01/2019:  Left ventricle cavity is normal in size and wall thickness. Normal LV systolic function with EF 56%. Normal global wall motion. Normal diastolic filling pattern.  Mild (Grade I) mitral regurgitation.  IVC is dilated with blunted respiratory response. This may suggest elevated right heart pressure   Stress Testing: Lexiscan  Sestamibi Stress Test 10/24/2019:  Nondiagnostic ECG stress. Resting EKG sinus rhythm with short PR interval.  Peak EKG/ECG revealed no ST-T wave abnormalities with Lexiscan infusion.  Myocardial perfusion is normal.  Overall LV systolic function is normal without regional wall motion abnormalities. Stress LV EF: 57%.  No previous exam available for comparison. Low risk.   Heart Catheterization: None  Abdominal Aortic Duplex  11/01/2019:  Moderate plaque noted in the abdominal aorta.  Mild ectasia noted in the distal abdominal aorta and measures 2.32 cm.  Bilateral internal iliac arteries demonstrate > 50% stenosis.    Consider rescan in 5-10 years to exclude aneurysm formation.   Carotid artery duplex 09/24/2021:  Duplex suggests stenosis in the right internal carotid artery (16-49%).  Duplex suggests stenosis in the left internal carotid artery (50-69%).  Moderate homogeneous plaque bilateral carotid arteries.  Antegrade right vertebral artery flow. Antegrade left vertebral artery flow.  Compared to the study done on 06/23/2020, no significant change. Follow up in  six months is appropriate if clinically indicated.   Lower Extremity Arterial Duplex 11/01/2019:  No hemodynamically significant stenoses are identified in the bilateral lower extremity arterial system.  Multiphasic waveform noted (biphasic) throughout the bilateral arterial system suggests mild diffuse disease.  This exam reveals normal perfusion of the right lower extremity (ABI 1.2) and normal perfusion of the left lower extremity (ABI 1.03).   LABORATORY DATA: Labs from 09/17/2019 reviewed (from PCP) Creatinine 0.80 mg/dL. eGFR: >60 mL/min per 1.73 m Lipid profile: Total cholesterol 169, triglycerides 146, HDL 30, LDL 110.  Lipid profile: Collected: 06/04/2020 Total cholesterol 164, HDL 47, triglycerides 124, LDL 94, non-HDL 117.  IMPRESSION:    ICD-10-CM   1. Bilateral carotid artery stenosis  I65.23 EKG 12-Lead    2. Smoking  F17.200     3. Dyslipidemia  E78.5        RECOMMENDATIONS: JAICE DIGIOIA is a 61 y.o. male whose past medical history and cardiac risk factors include: Dyslipidemia, bilateral carotid artery stenosis,  Hypothyroidism, active smoking.  Bilateral carotid artery stenosis Asymptomatic. No focal neurological deficits or vision changes. Recent carotid duplex notes bilateral disease relatively stable compared to prior study Continue aspirin and statin therapy We educated him on the importance of complete smoking cessation. We will repeat scan in 6 months to follow disease  progression.  Smoking Tobacco cessation counseling: Currently smoking <0.5 packs/day   Patient was informed of the dangers of tobacco abuse including stroke, cancer, and MI, as well as benefits of tobacco cessation. Patient is not willing to quit at this time. 5 mins were spent counseling patient cessation techniques. We discussed various methods to help quit smoking, including deciding on a date to quit, joining a support group, pharmacological agents- nicotine gum/patch/lozenges.   I will reassess his progress at the next follow-up visit  Dyslipidemia Currently on atorvastatin.   He denies myalgia or other side effects. No recent labs available for review.  Patient states that he sees PCP next month and will have repeat labs.  He is requested to send [place] a copy for reference.  Currently managed by primary care provider.  FINAL MEDICATION LIST END OF ENCOUNTER: No orders of the defined types were placed in this encounter.    Current Outpatient Medications:    aspirin EC 81 MG tablet, Take 81 mg by mouth daily. Swallow whole., Disp: , Rfl:    atorvastatin (LIPITOR) 40 MG tablet, Take 1 tablet (40 mg total) by mouth at bedtime., Disp: 90 tablet, Rfl: 0   gabapentin (NEURONTIN) 300 MG capsule, Take 600 mg by mouth 2 (two) times daily., Disp: , Rfl:    HYDROcodone-acetaminophen (NORCO) 10-325 MG per tablet, Take 1 tablet by mouth every 6 (six) hours as needed., Disp: 15 tablet, Rfl: 0   levothyroxine (SYNTHROID) 150 MCG tablet, Take 150 mcg by mouth every morning., Disp: , Rfl:   Orders Placed This Encounter  Procedures   EKG 12-Lead   There are no Patient Instructions on file for this visit.   --Continue cardiac medications as reconciled in final medication list. --Return in about 6 months (around 04/14/2022) for Follow up, Carotid disease. Or sooner if needed. --Continue follow-up with your primary care physician regarding the management of your other chronic comorbid conditions.  Patient's questions and concerns were addressed to his satisfaction. He voices understanding of the instructions provided during this encounter.   This note was created using a voice recognition software as a result there may be grammatical errors inadvertently enclosed that do not reflect the nature of this encounter. Every attempt is made to correct such errors.  Rex Kras, [place], [facility]  Pager: [phone] Office: [phone]

## 2021-11-16 ENCOUNTER — Other Ambulatory Visit: Payer: Self-pay | Admitting: Cardiology

## 2021-11-16 DIAGNOSIS — I6523 Occlusion and stenosis of bilateral carotid arteries: Secondary | ICD-10-CM

## 2022-02-11 ENCOUNTER — Other Ambulatory Visit: Payer: Self-pay | Admitting: Cardiology

## 2022-02-11 DIAGNOSIS — I6523 Occlusion and stenosis of bilateral carotid arteries: Secondary | ICD-10-CM
